# Patient Record
Sex: Male | Born: 1965 | Race: Black or African American | Hispanic: No | Marital: Single | Smoking: Current every day smoker
Health system: Southern US, Community
[De-identification: ages and names within clinical notes are randomized; demographics above are authoritative.]

## PROBLEM LIST (undated history)

## (undated) DIAGNOSIS — J45909 Unspecified asthma, uncomplicated: Secondary | ICD-10-CM

## (undated) DIAGNOSIS — I10 Essential (primary) hypertension: Secondary | ICD-10-CM

## (undated) DIAGNOSIS — E785 Hyperlipidemia, unspecified: Secondary | ICD-10-CM

## (undated) DIAGNOSIS — J449 Chronic obstructive pulmonary disease, unspecified: Secondary | ICD-10-CM

## (undated) DIAGNOSIS — J9690 Respiratory failure, unspecified, unspecified whether with hypoxia or hypercapnia: Secondary | ICD-10-CM

## (undated) HISTORY — DX: Unspecified asthma, uncomplicated: J45.909

## (undated) HISTORY — DX: Hyperlipidemia, unspecified: E78.5

## (undated) HISTORY — PX: NO PAST SURGERIES: SHX2092

## (undated) HISTORY — DX: Respiratory failure, unspecified, unspecified whether with hypoxia or hypercapnia: J96.90

## (undated) HISTORY — DX: Chronic obstructive pulmonary disease, unspecified: J44.9

---

## 1998-07-25 ENCOUNTER — Emergency Department (HOSPITAL_COMMUNITY): Admission: EM | Admit: 1998-07-25 | Discharge: 1998-07-25 | Payer: Self-pay | Admitting: Emergency Medicine

## 1998-07-26 ENCOUNTER — Encounter: Payer: Self-pay | Admitting: Emergency Medicine

## 2002-02-25 ENCOUNTER — Encounter: Payer: Self-pay | Admitting: Emergency Medicine

## 2002-02-25 ENCOUNTER — Emergency Department (HOSPITAL_COMMUNITY): Admission: EM | Admit: 2002-02-25 | Discharge: 2002-02-25 | Payer: Self-pay | Admitting: Emergency Medicine

## 2005-10-13 ENCOUNTER — Emergency Department (HOSPITAL_COMMUNITY): Admission: EM | Admit: 2005-10-13 | Discharge: 2005-10-13 | Payer: Self-pay | Admitting: Emergency Medicine

## 2009-05-11 ENCOUNTER — Emergency Department (HOSPITAL_COMMUNITY): Admission: EM | Admit: 2009-05-11 | Discharge: 2009-05-11 | Payer: Self-pay | Admitting: Emergency Medicine

## 2009-12-31 ENCOUNTER — Emergency Department (HOSPITAL_COMMUNITY): Admission: EM | Admit: 2009-12-31 | Discharge: 2009-12-31 | Payer: Self-pay | Admitting: Emergency Medicine

## 2010-04-27 LAB — POCT I-STAT, CHEM 8
BUN: 9 mg/dL (ref 6–23)
Calcium, Ion: 1.18 mmol/L (ref 1.12–1.32)
Chloride: 105 mEq/L (ref 96–112)
Creatinine, Ser: 1.1 mg/dL (ref 0.4–1.5)
Glucose, Bld: 93 mg/dL (ref 70–99)
HCT: 52 % (ref 39.0–52.0)
Hemoglobin: 17.7 g/dL — ABNORMAL HIGH (ref 13.0–17.0)
Potassium: 4.1 mEq/L (ref 3.5–5.1)
Sodium: 140 mEq/L (ref 135–145)
TCO2: 26 mmol/L (ref 0–100)

## 2010-06-16 ENCOUNTER — Emergency Department (HOSPITAL_COMMUNITY): Payer: Self-pay

## 2010-06-16 ENCOUNTER — Emergency Department (HOSPITAL_COMMUNITY)
Admission: EM | Admit: 2010-06-16 | Discharge: 2010-06-16 | Disposition: A | Discharge status: Home / Self Care | Payer: Self-pay | Attending: Emergency Medicine | Admitting: Emergency Medicine

## 2010-06-16 DIAGNOSIS — R220 Localized swelling, mass and lump, head: Secondary | ICD-10-CM | POA: Insufficient documentation

## 2010-06-16 DIAGNOSIS — I10 Essential (primary) hypertension: Secondary | ICD-10-CM | POA: Insufficient documentation

## 2010-06-16 DIAGNOSIS — H6690 Otitis media, unspecified, unspecified ear: Secondary | ICD-10-CM | POA: Insufficient documentation

## 2010-06-16 DIAGNOSIS — R51 Headache: Secondary | ICD-10-CM | POA: Insufficient documentation

## 2010-06-16 DIAGNOSIS — H7090 Unspecified mastoiditis, unspecified ear: Secondary | ICD-10-CM | POA: Insufficient documentation

## 2010-06-16 LAB — DIFFERENTIAL
Basophils Absolute: 0 10*3/uL (ref 0.0–0.1)
Basophils Relative: 1 % (ref 0–1)
Eosinophils Absolute: 0.2 10*3/uL (ref 0.0–0.7)
Eosinophils Relative: 4 % (ref 0–5)
Lymphocytes Relative: 33 % (ref 12–46)
Lymphs Abs: 2.1 10*3/uL (ref 0.7–4.0)
Monocytes Absolute: 0.6 10*3/uL (ref 0.1–1.0)
Monocytes Relative: 10 % (ref 3–12)
Neutro Abs: 3.3 10*3/uL (ref 1.7–7.7)
Neutrophils Relative %: 53 % (ref 43–77)

## 2010-06-16 LAB — CBC
Hemoglobin: 15 g/dL (ref 13.0–17.0)
MCH: 32.4 pg (ref 26.0–34.0)
MCHC: 33.3 g/dL (ref 30.0–36.0)
Platelets: 186 10*3/uL (ref 150–400)
RDW: 14.3 % (ref 11.5–15.5)

## 2010-06-16 MED ORDER — IOHEXOL 300 MG/ML  SOLN
100.0000 mL | Freq: Once | INTRAMUSCULAR | Status: AC | PRN
  Administered 2010-06-16: 100 mL via INTRAVENOUS

## 2010-06-18 LAB — POCT I-STAT, CHEM 8
Potassium: 4 mEq/L (ref 3.5–5.1)
Sodium: 140 mEq/L (ref 135–145)

## 2011-05-11 ENCOUNTER — Encounter (HOSPITAL_COMMUNITY): Payer: Self-pay

## 2011-05-11 ENCOUNTER — Telehealth (HOSPITAL_COMMUNITY): Payer: Self-pay | Admitting: *Deleted

## 2011-05-11 ENCOUNTER — Emergency Department (INDEPENDENT_AMBULATORY_CARE_PROVIDER_SITE_OTHER)
Admission: EM | Admit: 2011-05-11 | Discharge: 2011-05-11 | Disposition: A | Discharge status: Home / Self Care | Payer: Self-pay | Source: Home / Self Care | Attending: Family Medicine | Admitting: Family Medicine

## 2011-05-11 DIAGNOSIS — I10 Essential (primary) hypertension: Secondary | ICD-10-CM

## 2011-05-11 DIAGNOSIS — M25562 Pain in left knee: Secondary | ICD-10-CM

## 2011-05-11 DIAGNOSIS — M25569 Pain in unspecified knee: Secondary | ICD-10-CM

## 2011-05-11 HISTORY — DX: Morbid (severe) obesity due to excess calories: E66.01

## 2011-05-11 HISTORY — DX: Essential (primary) hypertension: I10

## 2011-05-11 MED ORDER — LISINOPRIL-HYDROCHLOROTHIAZIDE 20-25 MG PO TABS: 1.0000 | ORAL_TABLET | Freq: Every day | ORAL | Status: DC

## 2011-05-11 MED ORDER — TRAMADOL HCL 50 MG PO TABS: 50.0000 mg | ORAL_TABLET | Freq: Four times a day (QID) | ORAL | Status: AC | PRN

## 2011-05-11 MED ORDER — NAPROXEN 500 MG PO TABS: 500.0000 mg | ORAL_TABLET | Freq: Two times a day (BID) | ORAL | Status: DC

## 2011-05-11 NOTE — Discharge Instructions (Signed)
Take medications as directed. Also, continue to work on weight loss as discussed, as this will likely improve your symptoms. Once your Medicaid has been approved, please return for followup evaluation for x-rays and referral to an orthopedist. Also, please call the [facility] line listed on your discharge paperwork, and ask about primary providers accept Medicaid. Please establish care with a primary care provider. Return to care should your symptoms not improve, or worsen in any way, such as chest pain, shortness of breath, vision changes, or any symptom that might concern you.

## 2011-05-11 NOTE — ED Notes (Signed)
Pt. called on VM and wants to speak to Dr. Juanetta Gosling. I called pt. back and he said he came in and saw Dr. Juanetta Gosling. I said OK, thank you. Aaron Bishop 05/11/2011

## 2011-05-11 NOTE — ED Provider Notes (Signed)
History     CSN: [id number]  Arrival date & time 05/11/11  1003   First MD Initiated Contact with Patient 05/11/11 1037      Chief Complaint  Patient presents with  . Joint Pain    (Consider location/radiation/quality/duration/timing/severity/associated sxs/prior treatment) HPI Comments: Aaron Bishop presents for evaluation for high blood pressure, and chronic, bilateral knee pain. He reports old injuries in his right knee from playing football earlier in his life. He reports some ligamentous damage. He now reports chronic knee pain with walking and bending. He was also diagnosed with hypertension at this facility several months ago and started on blood pressure pills. He requests a refill of those today. He denies any chest pain, shortness of breath, visual changes, or headache. He reports that his Medicaid is pending, but he does not have a primary care provider at this time.  Patient is a 46 y.o. male presenting with knee pain. The history is provided by the patient.  Knee Pain This is a recurrent problem. The current episode started more than 1 week ago. The problem occurs constantly. The problem has been gradually worsening. The symptoms are aggravated by walking, bending and standing. The symptoms are relieved by acetaminophen and narcotics. He has tried nothing for the symptoms.    Past Medical History  Diagnosis Date  . Hypertension   . Morbid obesity     History reviewed. No pertinent past surgical history.  History reviewed. No pertinent family history.  History  Substance Use Topics  . Smoking status: Current Everyday Smoker  . Smokeless tobacco: Not on file  . Alcohol Use: Yes      Review of Systems  Constitutional: Negative.   HENT: Negative.   Eyes: Negative.   Respiratory: Negative.   Cardiovascular: Negative.   Gastrointestinal: Negative.   Genitourinary: Negative.   Musculoskeletal: Positive for arthralgias.  Skin: Negative.   Neurological: Negative.      Allergies  Review of patient's allergies indicates no known allergies.  Home Medications   Current Outpatient Rx  Name Route Sig Dispense Refill  . HYDROCHLOROTHIAZIDE 50 MG PO TABS Oral Take 50 mg by mouth daily.    Marland Kitchen LISINOPRIL 40 MG PO TABS Oral Take 40 mg by mouth daily.    Marland Kitchen LISINOPRIL-HYDROCHLOROTHIAZIDE 20-25 MG PO TABS Oral Take 1 tablet by mouth daily. 30 tablet 1  . NAPROXEN 500 MG PO TABS Oral Take 1 tablet (500 mg total) by mouth 2 (two) times daily with a meal. 60 tablet 0  . TRAMADOL HCL 50 MG PO TABS Oral Take 1 tablet (50 mg total) by mouth every 6 (six) hours as needed for pain. Maximum dose= 8 tablets per day 30 tablet 0    BP 160/96  Pulse 81  Temp(Src) 97.7 F (36.5 C) (Oral)  Resp 24  SpO2 97%  Physical Exam  Nursing note and vitals reviewed. Constitutional: He is oriented to person, place, and time. He appears well-developed and well-nourished.  HENT:  Head: Normocephalic and atraumatic.  Eyes: EOM are normal.  Neck: Normal range of motion.  Pulmonary/Chest: Effort normal.  Musculoskeletal: Normal range of motion.       Right knee: He exhibits normal range of motion, no swelling, no bony tenderness and normal meniscus. tenderness found. Medial joint line and lateral joint line tenderness noted. No patellar tendon tenderness noted.       RIGHT Knee: negative patellar grind, negative patellar facet tenderness, positive medial and lateral joint line tenderness, negative patellar tendon tenderness, negative  Lachman's, positive McMurray's; no laxity or pain with varus or valgus stress   Neurological: He is alert and oriented to person, place, and time.  Skin: Skin is warm and dry.  Psychiatric: His behavior is normal.    ED Course  Procedures (including critical care time)  Labs Reviewed - No data to display No results found.   1. Hypertension   2. Knee pain, bilateral       MDM  Refilled HTN medications; rx given for naproxen and  hydrocodone; will xray and refer to orthopaedics after Medicaid; given [facility] for PCP referral         Renaee Munda, MD 05/11/11 1225

## 2011-05-11 NOTE — ED Notes (Signed)
C/o pain in both knees, R>L , for some time; waiting for medicaid to be in effect; out of his BP medication, needs refills

## 2011-05-31 ENCOUNTER — Telehealth (HOSPITAL_COMMUNITY): Payer: Self-pay | Admitting: *Deleted

## 2011-05-31 NOTE — ED Notes (Signed)
Pt. called on VM and said he needs his meds refilled.  I called pt. back and he said he needs his BP pills and pain pills refilled. I told him he could take 2 Aleve 440 mg. that are equivalent to 1 Naprosyn 500 mg twice a day. He said he was taking Tramadol. I asked if we referred him to anyone and he said no. I looked up his d/c instructions and read them to him. It said to call [facility] and ask for doctors that take new Medicaid pt.'s and to get a PCP. He said he applied for Medicaid in Jan. and is trying to get disability. I told him we don't do disability papers or refills because we are not a primary care facility.  I told pt. he would have to come for a recheck and gave him the [facility] number. He thinks he will have his Medicaid by the end of the month. Vassie Moselle 05/31/2011

## 2011-12-27 ENCOUNTER — Inpatient Hospital Stay (HOSPITAL_COMMUNITY)
Admission: EM | Admit: 2011-12-27 | Discharge: 2012-01-01 | DRG: 190 | Disposition: A | Discharge status: Home / Self Care | Payer: 59 | Attending: Internal Medicine | Admitting: Internal Medicine

## 2011-12-27 ENCOUNTER — Emergency Department (HOSPITAL_COMMUNITY): Payer: Self-pay

## 2011-12-27 ENCOUNTER — Encounter (HOSPITAL_COMMUNITY): Payer: Self-pay | Admitting: *Deleted

## 2011-12-27 DIAGNOSIS — R609 Edema, unspecified: Secondary | ICD-10-CM | POA: Diagnosis present

## 2011-12-27 DIAGNOSIS — J440 Chronic obstructive pulmonary disease with acute lower respiratory infection: Principal | ICD-10-CM | POA: Diagnosis present

## 2011-12-27 DIAGNOSIS — R0602 Shortness of breath: Secondary | ICD-10-CM

## 2011-12-27 DIAGNOSIS — J9692 Respiratory failure, unspecified with hypercapnia: Secondary | ICD-10-CM

## 2011-12-27 DIAGNOSIS — J9600 Acute respiratory failure, unspecified whether with hypoxia or hypercapnia: Secondary | ICD-10-CM | POA: Diagnosis present

## 2011-12-27 DIAGNOSIS — J9620 Acute and chronic respiratory failure, unspecified whether with hypoxia or hypercapnia: Secondary | ICD-10-CM | POA: Diagnosis not present

## 2011-12-27 DIAGNOSIS — Z79899 Other long term (current) drug therapy: Secondary | ICD-10-CM

## 2011-12-27 DIAGNOSIS — Z6841 Body Mass Index (BMI) 40.0 and over, adult: Secondary | ICD-10-CM

## 2011-12-27 DIAGNOSIS — I10 Essential (primary) hypertension: Secondary | ICD-10-CM | POA: Diagnosis present

## 2011-12-27 DIAGNOSIS — R0601 Orthopnea: Secondary | ICD-10-CM

## 2011-12-27 DIAGNOSIS — J45901 Unspecified asthma with (acute) exacerbation: Secondary | ICD-10-CM

## 2011-12-27 DIAGNOSIS — G4733 Obstructive sleep apnea (adult) (pediatric): Secondary | ICD-10-CM | POA: Diagnosis present

## 2011-12-27 DIAGNOSIS — F17200 Nicotine dependence, unspecified, uncomplicated: Secondary | ICD-10-CM | POA: Diagnosis present

## 2011-12-27 DIAGNOSIS — J9691 Respiratory failure, unspecified with hypoxia: Secondary | ICD-10-CM

## 2011-12-27 DIAGNOSIS — R600 Localized edema: Secondary | ICD-10-CM

## 2011-12-27 DIAGNOSIS — J209 Acute bronchitis, unspecified: Principal | ICD-10-CM | POA: Diagnosis present

## 2011-12-27 LAB — PRO B NATRIURETIC PEPTIDE: Pro B Natriuretic peptide (BNP): 67.8 pg/mL (ref 0–125)

## 2011-12-27 LAB — URINALYSIS, ROUTINE W REFLEX MICROSCOPIC
Bilirubin Urine: NEGATIVE
Glucose, UA: >1000 mg/dL — AB
Hgb urine dipstick: NEGATIVE
Ketones, ur: NEGATIVE mg/dL
Leukocytes, UA: NEGATIVE
Nitrite: NEGATIVE
Protein, ur: NEGATIVE mg/dL
Specific Gravity, Urine: 1.015 (ref 1.005–1.030)
Urobilinogen, UA: 1 mg/dL (ref 0.0–1.0)
pH: 6.5 (ref 5.0–8.0)

## 2011-12-27 LAB — CBC WITH DIFFERENTIAL/PLATELET
Basophils Absolute: 0 K/uL (ref 0.0–0.1)
Basophils Relative: 0 % (ref 0–1)
Eosinophils Absolute: 0.2 K/uL (ref 0.0–0.7)
Eosinophils Relative: 5 % (ref 0–5)
HCT: 42.6 % (ref 39.0–52.0)
Hemoglobin: 14.4 g/dL (ref 13.0–17.0)
Lymphocytes Relative: 31 % (ref 12–46)
Lymphs Abs: 1.6 K/uL (ref 0.7–4.0)
MCH: 32.4 pg (ref 26.0–34.0)
MCHC: 33.8 g/dL (ref 30.0–36.0)
MCV: 95.7 fL (ref 78.0–100.0)
Monocytes Absolute: 0.6 K/uL (ref 0.1–1.0)
Monocytes Relative: 12 % (ref 3–12)
Neutro Abs: 2.6 K/uL (ref 1.7–7.7)
Neutrophils Relative %: 52 % (ref 43–77)
Platelets: 179 K/uL (ref 150–400)
RBC: 4.45 MIL/uL (ref 4.22–5.81)
RDW: 15.1 % (ref 11.5–15.5)
WBC: 5.1 K/uL (ref 4.0–10.5)

## 2011-12-27 LAB — TROPONIN I: Troponin I: <0.3 ng/mL (ref <0.30)

## 2011-12-27 LAB — BASIC METABOLIC PANEL
CO2: 30 mEq/L (ref 19–32)
Calcium: 9.3 mg/dL (ref 8.4–10.5)
Creatinine, Ser: 0.81 mg/dL (ref 0.50–1.35)

## 2011-12-27 LAB — URINE MICROSCOPIC-ADD ON

## 2011-12-27 MED ORDER — ALBUTEROL SULFATE (5 MG/ML) 0.5% IN NEBU
10.0000 mg | INHALATION_SOLUTION | Freq: Once | RESPIRATORY_TRACT | Status: AC
  Administered 2011-12-27: 10 mg via RESPIRATORY_TRACT

## 2011-12-27 MED ORDER — AZITHROMYCIN 500 MG PO TABS
500.0000 mg | ORAL_TABLET | Freq: Every day | ORAL | Status: DC
  Administered 2011-12-27: 500 mg via ORAL
  Filled 2011-12-27 (×2): qty 1

## 2011-12-27 MED ORDER — METHYLPREDNISOLONE SODIUM SUCC 125 MG IJ SOLR
60.0000 mg | Freq: Four times a day (QID) | INTRAMUSCULAR | Status: DC
  Administered 2011-12-27 – 2011-12-29 (×6): 60 mg via INTRAVENOUS
  Filled 2011-12-27 (×3): qty 0.96
  Filled 2011-12-27: qty 2
  Filled 2011-12-27 (×7): qty 0.96

## 2011-12-27 MED ORDER — IPRATROPIUM BROMIDE 0.02 % IN SOLN
0.5000 mg | Freq: Once | RESPIRATORY_TRACT | Status: AC
Start: 2011-12-27 — End: 2011-12-27
  Administered 2011-12-27: 0.5 mg via RESPIRATORY_TRACT
  Filled 2011-12-27: qty 2.5

## 2011-12-27 MED ORDER — ALBUTEROL SULFATE (5 MG/ML) 0.5% IN NEBU: 2.5000 mg | INHALATION_SOLUTION | Freq: Once | RESPIRATORY_TRACT | Status: DC

## 2011-12-27 MED ORDER — ALBUTEROL SULFATE (5 MG/ML) 0.5% IN NEBU
2.5000 mg | INHALATION_SOLUTION | RESPIRATORY_TRACT | Status: DC
  Administered 2011-12-27 – 2011-12-31 (×20): 2.5 mg via RESPIRATORY_TRACT
  Filled 2011-12-27 (×21): qty 0.5

## 2011-12-27 MED ORDER — ALBUTEROL SULFATE (5 MG/ML) 0.5% IN NEBU
2.5000 mg | INHALATION_SOLUTION | RESPIRATORY_TRACT | Status: DC
  Administered 2011-12-27: 2.5 mg via RESPIRATORY_TRACT
  Filled 2011-12-27: qty 0.5

## 2011-12-27 MED ORDER — ALBUTEROL SULFATE (5 MG/ML) 0.5% IN NEBU
2.5000 mg | INHALATION_SOLUTION | Freq: Once | RESPIRATORY_TRACT | Status: AC
  Administered 2011-12-27: 2.5 mg via RESPIRATORY_TRACT

## 2011-12-27 MED ORDER — IPRATROPIUM BROMIDE 0.02 % IN SOLN
0.5000 mg | Freq: Three times a day (TID) | RESPIRATORY_TRACT | Status: DC
  Administered 2011-12-28: 0.5 mg via RESPIRATORY_TRACT
  Filled 2011-12-27: qty 2.5

## 2011-12-27 MED ORDER — METHYLPREDNISOLONE SODIUM SUCC 125 MG IJ SOLR
125.0000 mg | Freq: Once | INTRAMUSCULAR | Status: AC
  Administered 2011-12-27: 125 mg via INTRAVENOUS
  Filled 2011-12-27: qty 2

## 2011-12-27 MED ORDER — ENOXAPARIN SODIUM 100 MG/ML ~~LOC~~ SOLN
100.0000 mg | SUBCUTANEOUS | Status: DC
  Administered 2011-12-27: 100 mg via SUBCUTANEOUS
  Filled 2011-12-27 (×2): qty 1

## 2011-12-27 MED ORDER — LISINOPRIL 40 MG PO TABS
40.0000 mg | ORAL_TABLET | Freq: Every day | ORAL | Status: DC
  Administered 2011-12-27: 40 mg via ORAL
  Filled 2011-12-27 (×2): qty 1

## 2011-12-27 MED ORDER — ALBUTEROL SULFATE (5 MG/ML) 0.5% IN NEBU
2.5000 mg | INHALATION_SOLUTION | RESPIRATORY_TRACT | Status: DC | PRN
  Administered 2011-12-28: 2.5 mg via RESPIRATORY_TRACT
  Filled 2011-12-27: qty 0.5

## 2011-12-27 MED ORDER — SODIUM CHLORIDE 0.9 % IJ SOLN
3.0000 mL | Freq: Two times a day (BID) | INTRAMUSCULAR | Status: DC
  Administered 2011-12-27 – 2012-01-01 (×6): 3 mL via INTRAVENOUS

## 2011-12-27 MED ORDER — ALBUTEROL SULFATE HFA 108 (90 BASE) MCG/ACT IN AERS
2.0000 | INHALATION_SPRAY | Freq: Four times a day (QID) | RESPIRATORY_TRACT | Status: DC
  Filled 2011-12-27: qty 6.7

## 2011-12-27 MED ORDER — GUAIFENESIN-DM 100-10 MG/5ML PO SYRP
5.0000 mL | ORAL_SOLUTION | ORAL | Status: DC | PRN
Start: 2011-12-27 — End: 2012-01-01

## 2011-12-27 MED ORDER — NITROGLYCERIN 0.4 MG SL SUBL
0.4000 mg | SUBLINGUAL_TABLET | SUBLINGUAL | Status: DC | PRN
  Administered 2011-12-27: 0.4 mg via SUBLINGUAL

## 2011-12-27 MED ORDER — FUROSEMIDE 10 MG/ML IJ SOLN
40.0000 mg | Freq: Two times a day (BID) | INTRAMUSCULAR | Status: DC
  Administered 2011-12-27 – 2011-12-28 (×2): 40 mg via INTRAVENOUS
  Filled 2011-12-27 (×3): qty 4

## 2011-12-27 MED ORDER — SODIUM CHLORIDE 0.9 % IJ SOLN: 3.0000 mL | INTRAMUSCULAR | Status: DC | PRN

## 2011-12-27 MED ORDER — ALBUTEROL SULFATE (5 MG/ML) 0.5% IN NEBU
INHALATION_SOLUTION | RESPIRATORY_TRACT | Status: AC
  Administered 2011-12-27: 5 mg
  Filled 2011-12-27: qty 1

## 2011-12-27 MED ORDER — ALBUTEROL SULFATE (5 MG/ML) 0.5% IN NEBU
2.5000 mg | INHALATION_SOLUTION | Freq: Once | RESPIRATORY_TRACT | Status: DC
  Filled 2011-12-27: qty 0.5

## 2011-12-27 MED ORDER — SODIUM CHLORIDE 0.9 % IV SOLN: 250.0000 mL | INTRAVENOUS | Status: DC | PRN

## 2011-12-27 MED ORDER — BIOTENE DRY MOUTH MT LIQD
15.0000 mL | Freq: Two times a day (BID) | OROMUCOSAL | Status: DC
  Administered 2011-12-27 – 2011-12-28 (×2): 15 mL via OROMUCOSAL

## 2011-12-27 MED ORDER — NITROGLYCERIN 0.4 MG SL SUBL
SUBLINGUAL_TABLET | SUBLINGUAL | Status: AC
  Administered 2011-12-27: 0.4 mg via SUBLINGUAL
  Filled 2011-12-27: qty 25

## 2011-12-27 NOTE — ED Notes (Signed)
Pt presents with acute respiratory distress. Onset 3 days.  Out of meds x 1 day

## 2011-12-27 NOTE — H&P (Addendum)
[facility] History and Physical  Aaron Bishop WGN:[id number] DOB: 1966/01/05 DOA: 12/27/2011  Referring physician:Dr bonk PCP: does not have PCP   Chief Complaint: progressive shortness of breath x 3 weeks  HPI:  46 y/o morbidly obese male with hx of HTN presents to ED withe progressive SOB since 3 weeks . patient also informs worsening of his symptoms with dyspnea at rest along with orthopnea and PND for past 7-10 days . He has been having cough with whitish phlegm for the same time and more wheezy. Denies any chest pain, palpitations, nausea, vomiting, fever, or chills. Denies any bowel or urinary symptoms. He informs having leg welling off and on and was prescribed HCTZ as oupt but hasn't taker it  since 3 months.  He informs  smoking 6-7 cigarettes daily for past several years.  In the ED he was noted to desat on RA and placed on nasal canula. IV solumedrol was given .  CXR was negative for pulm edema and showed some atelectasis. Pro BNP was low. [facility] called to admit patient under observation for shortness of breath.   Review of Systems:  Constitutional: Denies fever, chills, diaphoresis, appetite change and fatigue.  HEENT: Denies photophobia, eye pain, redness, hearing loss, ear pain, congestion, sore throat, rhinorrhea, sneezing, mouth sores, trouble swallowing, neck pain, neck stiffness and tinnitus.   Respiratory:  SOB at rest , DOE, cough with whitish phlegm and wheezing.  , Denies chest tightness,   Cardiovascular: orthopnea and PND, Denies chest pain, palpitations,  leg swelling +.  Gastrointestinal: Denies nausea, vomiting, abdominal pain, diarrhea, constipation, blood in stool and abdominal distention.  Genitourinary: Denies dysuria, urgency, frequency, hematuria, flank pain and difficulty urinating.  Musculoskeletal: Denies myalgias, back pain, joint swelling, arthralgias and gait problem.  Skin: Denies pallor, rash and wound.  Neurological: Denies  dizziness, seizures, syncope, weakness, light-headedness, numbness and headaches.  Hematological: Denies adenopathy. Easy bruising, personal or family bleeding history  Psychiatric/Behavioral: Denies suicidal ideation, mood changes, confusion, nervousness, sleep disturbance and agitation   Past Medical History  Diagnosis Date  . Hypertension   . Morbid obesity    Past Surgical History  Procedure Date  . No past surgeries    Social History:  reports that he has been smoking Cigarettes.  He has a 15 pack-year smoking history. He has never used smokeless tobacco. He reports that he drinks alcohol. He reports that he does not use illicit drugs.  No Known Allergies  Family history: not obtained  Prior to Admission medications   Medication Sig Start Date End Date Taking? Authorizing Provider  acetaminophen (TYLENOL) 500 MG tablet Take 500 mg by mouth every 6 (six) hours as needed. pain   Yes Historical Provider, MD  cephALEXin (KEFLEX) 500 MG capsule Take 500 mg by mouth 4 (four) times daily. x7 days filled 11/1   Yes Historical Provider, MD  hydrochlorothiazide (HYDRODIURIL) 50 MG tablet Take 50 mg by mouth daily.   Yes Historical Provider, MD  lisinopril (PRINIVIL,ZESTRIL) 40 MG tablet Take 40 mg by mouth daily.   Yes Historical Provider, MD  loratadine (CLARITIN) 10 MG tablet Take 10 mg by mouth daily.   Yes Historical Provider, MD    Physical Exam:  Filed Vitals:   12/27/11 1032 12/27/11 1043 12/27/11 1331 12/27/11 1400  BP: 168/87  145/73 154/69  Pulse: 87  96 82  Resp: 25  20 21   SpO2: 94% 96% 95% 96%    Constitutional: Vital signs reviewed.  Patient is a  morbidly obese male lying inn bed , able to speak in full sentences. Head: Normocephalic and atraumatic Ear: TM normal bilaterally Mouth: no erythema or exudates, MMM Eyes: PERRL, EOMI, conjunctivae normal, No scleral icterus.  Neck: Supple, Trachea midline normal ROM, No JVD, mass, thyromegaly, or carotid bruit present.    Cardiovascular: RRR, S1 normal, S2 normal, no MRG, pulses symmetric and intact bilaterally Pulmonary/Chest: CTAB, diminished breath sounds due to body habitus Abdominal: obese, Soft. Non-tender, non-distended, bowel sounds are normal, no masses, organomegaly, or guarding present.  GU: no CVA tenderness Musculoskeletal: No joint deformities, erythema, or stiffness, ROM full and no nontender Ext: 2 + pitting edema,   no cyanosis, pulses palpable bilaterally (DP and PT) Hematology: no cervical, inginal, or axillary adenopathy.  Neurological: A&O x3, Strenght is normal and symmetric bilaterally, cranial nerve II-XII are grossly intact, no focal motor deficit, sensory intact to light touch bilaterally.  Skin: Warm, dry and intact. No rash, cyanosis, or clubbing.  Psychiatric: Normal mood and affect. speech and behavior is normal. Judgment and thought content normal. Cognition and memory are normal.   Labs on Admission:  Basic Metabolic Panel:  Lab 12/27/11 1610  NA 139  K 4.9  CL 103  CO2 30  GLUCOSE 104*  BUN 10  CREATININE 0.81  CALCIUM 9.3  MG --  PHOS --   Liver Function Tests: No results found for this basename: AST:5,ALT:5,ALKPHOS:5,BILITOT:5,PROT:5,ALBUMIN:5 in the last 168 hours No results found for this basename: LIPASE:5,AMYLASE:5 in the last 168 hours No results found for this basename: AMMONIA:5 in the last 168 hours CBC:  Lab 12/27/11 1040  WBC 5.1  NEUTROABS 2.6  HGB 14.4  HCT 42.6  MCV 95.7  PLT 179   Cardiac Enzymes:  Lab 12/27/11 1145  CKTOTAL --  CKMB --  CKMBINDEX --  TROPONINI <0.30   BNP: No components found with this basename: POCBNP:5 CBG: No results found for this basename: GLUCAP:5 in the last 168 hours  Radiological Exams on Admission: Dg Chest 2 View  12/27/2011  *RADIOLOGY REPORT*  Clinical Data: Shortness of breath, asthma  CHEST - 2 VIEW  Comparison: 05/11/2009  Findings: Study is markedly limited by patient's large body habitus.   Cardiomegaly is noted.  Lung bases are obscured by overlying chest wall tissue.  I cannot exclude bilateral basilar atelectasis or infiltrate.  There is no convincing pulmonary edema. No diagnostic pneumothorax.  IMPRESSION: Study is markedly limited by patient's large body habitus. Cardiomegaly is noted.  Lung bases are obscured by overlying chest wall tissue.  I cannot exclude bilateral basilar atelectasis or infiltrate.  There is no convincing pulmonary edema.  No diagnostic pneumothorax.   Original Report Authenticated By: Natasha Mead, M.D.     EKG: NSR @86 , no ST-T changes  Assessment/Plan  Principal Problem:   *Shortness of breath Possibly related to reactive airway disease with asthma and bronchitis vs CHF exacerbation. Given symptoms of progressive SOB ion rst woth orthopnea and PND and worsenifn elg swelling, CHF is a high likelyhood.  Admit to medical floor under 23 hour observation  -started on IV solumedriol 60 mg q6h. Scheduled and prn nebs  -will place on IV lasix 40 mg bid  daily weights and I/Os  check 2 D echo  o2 via [place]  Active Problems:  OSA (obstructive sleep apnea) As per history. Has severe snoring during night with frequent awakening and daytime sleepiness  needs outpatient follow up   Morbid obesity counsled on weight reduction   Hypertension Was  on HCTZ and lisinopril in the  past . Hs not taken meds since 3 months Will start lisinopril   Tobacco abuse  counseled on cessation  DVT prophylaxis: sq lovenox  Diet: low sodium  Code Status: full Family Communication: Updated at bedside Disposition Plan: home once stable  Eddie North [facility] Pager [phone]  If 7PM-7AM, please contact night-coverage [email or web address] Password [facility] 12/27/2011, 4:44 PM      Total time spent: 60 minutes

## 2011-12-27 NOTE — ED Provider Notes (Signed)
History     CSN: [id number]  Arrival date & time 12/27/11  1018   First MD Initiated Contact with Patient 12/27/11 1032      No chief complaint on file.   (Consider location/radiation/quality/duration/timing/severity/associated sxs/prior treatment) HPICharles E Bishop is a 46 y.o. male with a h/o HTN, morbid obesity, SOB at rest and with activity and orthopnea for a few weeks now that has acutely worsened over last few days and now it is severe.  Pt has not taken or done anything for this condition, except that he sleeps almost upright.  He has a cough productive of scant white phlegm, no fevers, chills or chest pain.  He is also complaining about lower extremity edema which has worsening acutely.  He smoke cigarettes.    Past Medical History  Diagnosis Date  . Hypertension   . Morbid obesity     No past surgical history on file.  No family history on file.  History  Substance Use Topics  . Smoking status: Current Every Day Smoker  . Smokeless tobacco: Not on file  . Alcohol Use: Yes      Review of Systems At least 10pt or greater review of systems completed and are negative except where specified in the HPI.  Allergies  Review of patient's allergies indicates no known allergies.  Home Medications   Current Outpatient Rx  Name  Route  Sig  Dispense  Refill  . HYDROCHLOROTHIAZIDE 50 MG PO TABS   Oral   Take 50 mg by mouth daily.         Marland Kitchen LISINOPRIL 40 MG PO TABS   Oral   Take 40 mg by mouth daily.         Marland Kitchen LISINOPRIL-HYDROCHLOROTHIAZIDE 20-25 MG PO TABS   Oral   Take 1 tablet by mouth daily.   30 tablet   1   . NAPROXEN 500 MG PO TABS   Oral   Take 1 tablet (500 mg total) by mouth 2 (two) times daily with a meal.   60 tablet   0     Pulse 110  Resp 32  SpO2 83%  Physical Exam  Nursing notes reviewed.  Electronic medical record reviewed. VITAL SIGNS:   Filed Vitals:   01/01/12 1010 01/01/12 1011 01/01/12 1012 01/01/12 1345  BP:     151/66  Pulse:    83  Temp:    98.2 F (36.8 C)  TempSrc:    Oral  Resp:    18  Height:      Weight:      SpO2: 85% 77% 95% 93%   CONSTITUTIONAL: Awake, oriented, appears non-toxic, increased WOB HENT: Atraumatic, normocephalic, oral mucosa pink and moist, airway patent. Nares patent without drainage. External ears normal. EYES: Conjunctiva clear, EOMI, PERRLA NECK: Trachea midline, non-tender, supple CARDIOVASCULAR: Tachycardic rate, Normal rhythm, No murmurs, rubs, gallops PULMONARY/CHEST: Mild wheezing, moving fair about of air, mild crackles in bases. Symmetrical breath sounds. Non-tender. ABDOMINAL: Non-distended, morbidly obese, soft, non-tender - no rebound or guarding.  BS normal. NEUROLOGIC: Non-focal, moving all four extremities, no gross sensory or motor deficits. EXTREMITIES: No clubbing, cyanosis, or edema SKIN: Warm, Dry, No erythema, No rash   ED Course  Procedures (including critical care time)  Date: 01/03/2012  Rate: 86  Rhythm: normal sinus rhythm  QRS Axis: normal  Intervals: normal  ST/T Wave abnormalities: normal  Conduction Disutrbances: none  Narrative Interpretation: unremarkable     Labs Reviewed  BASIC METABOLIC PANEL - Abnormal;  Notable for the following:    Glucose, Bld 104 (*)     All other components within normal limits  URINALYSIS, ROUTINE W REFLEX MICROSCOPIC - Abnormal; Notable for the following:    APPearance CLOUDY (*)     Glucose, UA >1000 (*)     All other components within normal limits  BLOOD GAS, ARTERIAL - Abnormal; Notable for the following:    pH, Arterial 7.277 (*)     pCO2 arterial 65.8 (*)     pO2, Arterial 78.5 (*)     Bicarbonate 29.7 (*)     All other components within normal limits  BLOOD GAS, ARTERIAL - Abnormal; Notable for the following:    pH, Arterial 7.296 (*)     pCO2 arterial 69.0 (*)     Bicarbonate 32.6 (*)     Acid-Base Excess 4.1 (*)     All other components within normal limits  BLOOD GAS,  ARTERIAL - Abnormal; Notable for the following:    pH, Arterial 7.323 (*)     pCO2 arterial 64.9 (*)     pO2, Arterial 69.7 (*)     Bicarbonate 32.7 (*)     Acid-Base Excess 4.8 (*)     All other components within normal limits  CBC - Abnormal; Notable for the following:    WBC 13.3 (*)     All other components within normal limits  BASIC METABOLIC PANEL - Abnormal; Notable for the following:    CO2 33 (*)     Glucose, Bld 204 (*)     All other components within normal limits  BLOOD GAS, ARTERIAL - Abnormal; Notable for the following:    pCO2 arterial 51.9 (*)     pO2, Arterial 63.4 (*)     Bicarbonate 30.3 (*)     Acid-Base Excess 4.5 (*)     All other components within normal limits  BLOOD GAS, ARTERIAL - Abnormal; Notable for the following:    pCO2 arterial 52.6 (*)     pO2, Arterial 68.7 (*)     Bicarbonate 31.7 (*)     Acid-Base Excess 5.8 (*)     All other components within normal limits  CBC - Abnormal; Notable for the following:    WBC 10.6 (*)     All other components within normal limits  BASIC METABOLIC PANEL - Abnormal; Notable for the following:    Chloride 95 (*)     CO2 34 (*)     Glucose, Bld 173 (*)     All other components within normal limits  BASIC METABOLIC PANEL - Abnormal; Notable for the following:    CO2 33 (*)     Glucose, Bld 174 (*)     All other components within normal limits  CBC WITH DIFFERENTIAL  PRO B NATRIURETIC PEPTIDE  TROPONIN I  URINE MICROSCOPIC-ADD ON  MRSA PCR SCREENING  CBC  LAB REPORT - SCANNED   No results found.   1. Asthma exacerbation   2. Morbid obesity   3. Lower extremity edema   4. Orthopnea   5. Bilateral leg edema   6. Hypertension   7. Shortness of breath   8. OSA (obstructive sleep apnea)   9. Respiratory failure with hypercapnia   10. Respiratory failure with hypoxia       MDM  Aaron Bishop is a 46 y.o. male presenting with worsening dyspnea.  Pt has h/o asthma, but also has risk factors for  cardiogenic dyspnea such as CHF.  Pt given steroids, nebs in ER with minimal improvement - pt requiring O2 - does not use at home. CXR shows no infiltrates. Labs non-diagnostic.  Pt will require work up and further medical therapy as IP.  Pt also complains of snoring loudly and daytime sleepiness, likely also OSA.  D/W Hospitalist for admission         Jones Skene, MD 01/03/12 1647

## 2011-12-27 NOTE — ED Notes (Signed)
Patient O2 saturation with 2 Liters of oxygen was 97%.  When Oxygen was removed patients saturation level dropped to 90% .  Patient ambulated 30 feet and his O2 saturation stayed from 90% to 92% on room air.

## 2011-12-28 DIAGNOSIS — I517 Cardiomegaly: Secondary | ICD-10-CM

## 2011-12-28 DIAGNOSIS — J45901 Unspecified asthma with (acute) exacerbation: Secondary | ICD-10-CM

## 2011-12-28 DIAGNOSIS — J9691 Respiratory failure, unspecified with hypoxia: Secondary | ICD-10-CM | POA: Diagnosis not present

## 2011-12-28 DIAGNOSIS — J9620 Acute and chronic respiratory failure, unspecified whether with hypoxia or hypercapnia: Secondary | ICD-10-CM | POA: Diagnosis not present

## 2011-12-28 DIAGNOSIS — J9611 Chronic respiratory failure with hypoxia: Secondary | ICD-10-CM | POA: Diagnosis not present

## 2011-12-28 LAB — BLOOD GAS, ARTERIAL
Acid-Base Excess: 1.3 mmol/L (ref 0.0–2.0)
Acid-Base Excess: 4.1 mmol/L — ABNORMAL HIGH (ref 0.0–2.0)
Bicarbonate: 29.7 mEq/L — ABNORMAL HIGH (ref 20.0–24.0)
Bicarbonate: 32.6 mEq/L — ABNORMAL HIGH (ref 20.0–24.0)
Bicarbonate: 30.3 mEq/L — ABNORMAL HIGH (ref 20.0–24.0)
Delivery systems: POSITIVE
Delivery systems: POSITIVE
Drawn by: 317871
Drawn by: 317871
Drawn by: 295031
Expiratory PAP: 10
Expiratory PAP: 10
FIO2: 0.35 %
FIO2: 0.4 %
FIO2: 0.4 %
Inspiratory PAP: 20
Inspiratory PAP: 20
O2 Content: 3 L/min
O2 Saturation: 94.5 %
O2 Saturation: 96.8 %
O2 Saturation: 93.3 %
O2 Saturation: 92.2 %
Patient temperature: 98.6
Patient temperature: 98.6
Patient temperature: 98.6
Patient temperature: 98.6
TCO2: 26.6 mmol/L (ref 0–100)
TCO2: 29.3 mmol/L (ref 0–100)
pCO2 arterial: 65.8 mmHg (ref 35.0–45.0)
pCO2 arterial: 69 mmHg (ref 35.0–45.0)
pH, Arterial: 7.277 — ABNORMAL LOW (ref 7.350–7.450)
pH, Arterial: 7.296 — ABNORMAL LOW (ref 7.350–7.450)
pO2, Arterial: 78.5 mmHg — ABNORMAL LOW (ref 80.0–100.0)
pO2, Arterial: 92.8 mmHg (ref 80.0–100.0)

## 2011-12-28 LAB — MRSA PCR SCREENING: MRSA by PCR: NEGATIVE

## 2011-12-28 MED ORDER — IPRATROPIUM BROMIDE 0.02 % IN SOLN: 0.5000 mg | RESPIRATORY_TRACT | Status: DC | PRN

## 2011-12-28 MED ORDER — DEXTROSE 5 % IV SOLN
500.0000 mg | INTRAVENOUS | Status: DC
  Administered 2011-12-28 – 2011-12-29 (×2): 500 mg via INTRAVENOUS
  Filled 2011-12-28 (×2): qty 500

## 2011-12-28 MED ORDER — SENNOSIDES-DOCUSATE SODIUM 8.6-50 MG PO TABS
1.0000 | ORAL_TABLET | Freq: Two times a day (BID) | ORAL | Status: DC
  Administered 2011-12-28 – 2012-01-01 (×6): 1 via ORAL
  Filled 2011-12-28 (×10): qty 1

## 2011-12-28 MED ORDER — IPRATROPIUM BROMIDE 0.02 % IN SOLN
0.5000 mg | RESPIRATORY_TRACT | Status: DC
  Administered 2011-12-28 – 2011-12-30 (×11): 0.5 mg via RESPIRATORY_TRACT
  Filled 2011-12-28 (×12): qty 2.5

## 2011-12-28 MED ORDER — CHLORHEXIDINE GLUCONATE 0.12 % MT SOLN
15.0000 mL | Freq: Two times a day (BID) | OROMUCOSAL | Status: DC
  Administered 2011-12-29 – 2012-01-01 (×3): 15 mL via OROMUCOSAL
  Filled 2011-12-28 (×9): qty 15

## 2011-12-28 MED ORDER — ENOXAPARIN SODIUM 40 MG/0.4ML ~~LOC~~ SOLN
40.0000 mg | SUBCUTANEOUS | Status: DC
  Administered 2011-12-28 – 2011-12-31 (×4): 40 mg via SUBCUTANEOUS
  Filled 2011-12-28 (×5): qty 0.4

## 2011-12-28 MED ORDER — BIOTENE DRY MOUTH MT LIQD
15.0000 mL | Freq: Two times a day (BID) | OROMUCOSAL | Status: DC
  Administered 2011-12-29 – 2011-12-31 (×6): 15 mL via OROMUCOSAL

## 2011-12-28 MED ORDER — FUROSEMIDE 10 MG/ML IJ SOLN
20.0000 mg | Freq: Two times a day (BID) | INTRAMUSCULAR | Status: DC
  Administered 2011-12-28: 20 mg via INTRAVENOUS
  Filled 2011-12-28 (×2): qty 2

## 2011-12-28 NOTE — Progress Notes (Signed)
Patient began desatting on continous pulse oximetry to the low 80's on [place] 3L. Called NP Schorr about history of sleep apnea. Received verbal order to place on Venti mask for mouth breathing. Called into the room by patient's visitor found to be diaphoretic and lethargic. Vital signs taken. Patient became agitated, pulling off gown and mask and tremoring/shaking leg. Rapid Response Team called. ABG ordered, and patient transferred to [facility]. Report called to Elmarie Shiley, Charity fundraiser.

## 2011-12-28 NOTE — Significant Event (Signed)
Rapid Response Event Note  Overview: Time Called: 0150 Arrival Time: 0155 Event Type: Respiratory  Initial Focused Assessment: Pt with snoring respirations and difficult to arouse. He was able to wake up but very drowsy. Was on venti mask at 50%. Respiratory in room and NP called for ABG     Interventions: FIO2 weaned to 35% and resp treatment given. Abg done and called to NP   Event Summary:Pt transferred to 1238 for bipap Name of Physician Notified: Margaretmary Eddy NP at 0205 Event ended at 0300   at          [place] B

## 2011-12-28 NOTE — Progress Notes (Signed)
Patient ID: Aaron Bishop, male   DOB: 11/11/1965, 46 y.o.   MRN: [id number] [facility] PROGRESS NOTE  KOURTLAND COOPMAN KGM:[id number] DOB: [birth year] DOA: 12/27/2011 PCP: No primary provider on file.  Brief narrative: Pt is 46 y/o morbidly obese male with hx of HTN presented to ED with progressively worsening resting and exertional SOB that initially started 3 weeks prior to admission, associated with productive cough of yellow sputum, wheezing, subjective fevers and chills. Patient denies similar episodes in the past. He endorses one pack per day smoking history since age of 18. In emergency department patient desaturated to low 80%'s and has required admission to stepdown unit for further evaluation.   Principal Problem:  *Respiratory failure with hypercapnia and hypoxia - Based on clinical symptoms and imaging tests this is most likely secondary to acute exacerbation of COPD - We'll continue to monitor patient in step down unit, pCO2 on ABG is >60 mmHg - Patient is currently on BiPAP and will continue so, repeat ABG in 2 hours - Continue supportive care with nebulizers scheduled and as needed, empiric antibiotics azithromycin, Solu-Medrol IV - Patient will need pulmonary function tests eventually upon discharge Active Problems:  OSA (obstructive sleep apnea) - Patient is not aware of diagnosis of obstructive sleep apnea but is very likely given body habitus - Continue BiPAP for now, based on pulmonary function test patient will likely need CPAP at night time as well  Morbid obesity - Will discuss nutrition recommendations once patient is more clinically stable - BMI greater than 50  Hypertension - Will discontinue lisinopril for now as patient has been having chronic cough - Followup on 2-D echo and decide if ACE inhibitor needs to be restarted   Bilateral leg edema - Likely secondary to congestive heart failure - We'll continue Lasix IV for now - Followup on 2-D  echo  Consultants:  None  Procedures/Studies: Dg Chest 2 View 12/27/2011    IMPRESSION:   Study is markedly limited by patient's large body habitus. Cardiomegaly is noted.  Lung bases are obscured by overlying chest wall tissue.  I cannot exclude bilateral basilar atelectasis or infiltrate.  There is no convincing pulmonary edema.  No diagnostic pneumothorax.     Antibiotics:  Zithromax 11/12 -->  Code Status: Full Family Communication: Mother at bedside Disposition Plan: Home when medically stable  HPI/Subjective: No events overnight.   Objective: Filed Vitals:   12/28/11 1100 12/28/11 1155 12/28/11 1200 12/28/11 1415  BP:    131/71  Pulse: 73 79  88  Temp:   98 F (36.7 C)   TempSrc:   Axillary   Resp: 17 17  19   Height:      Weight:      SpO2: 96% 94%  92%    Intake/Output Summary (Last 24 hours) at 12/28/11 1458 Last data filed at 12/28/11 1400  Gross per 24 hour  Intake    250 ml  Output   1600 ml  Net  -1350 ml    Exam:   General:  Pt is sleeping, opens eyes when called by name, on BiPAP  Cardiovascular: Regular rate and rhythm, S1/S2, no murmurs, no rubs, no gallops  Respiratory: Decreased breath sounds bilaterally, with mild end expiratory wheezing   Abdomen: Soft, non tender, non distended, bowel sounds present, no guarding  Extremities: Bilateral pitting edema + 1, pulses DP and PT palpable bilaterally  Neuro: Grossly nonfocal  Data Reviewed: Basic Metabolic Panel:  Lab 12/27/11 6440  NA 139  K 4.9  CL 103  CO2 30  GLUCOSE 104*  BUN 10  CREATININE 0.81  CALCIUM 9.3  MG --  PHOS --   CBC:  Lab 12/27/11 1040  WBC 5.1  NEUTROABS 2.6  HGB 14.4  HCT 42.6  MCV 95.7  PLT 179   Cardiac Enzymes:  Lab 12/27/11 1145  CKTOTAL --  CKMB --  CKMBINDEX --  TROPONINI <0.30    Recent Results (from the past 240 hour(s))  MRSA PCR SCREENING     Status: Normal   Collection Time   12/28/11  3:27 AM      Component Value Range  Status Comment   MRSA by PCR NEGATIVE  NEGATIVE Final      Scheduled Meds:   . [COMPLETED] albuterol  2.5 mg Nebulization Once  . albuterol  2.5 mg Nebulization Q4H  . azithromycin  500 mg Intravenous Q24H  . enoxaparin  injection  40 mg Subcutaneous Q24H  . furosemide  20 mg Intravenous Q12H  . ipratropium  0.5 mg Nebulization Q4H  . methylPREDNISolone  inj  60 mg Intravenous Q6H  . senna-docusate  1 tablet Oral BID   Continuous Infusions:    Debbora Presto, MD  [facility] Pager [phone]  If 7PM-7AM, please contact night-coverage [email or web address] Password [email or web address] 12/28/2011, 2:58 PM   LOS: 1 day

## 2011-12-28 NOTE — Progress Notes (Signed)
Event: 0205:  RN notified that pt had been sleeping very soundly and at times sats would drop into 80's w/ rapid return to baseline when he is awakened. Pt then found sleeping soundly w/ snoring type respirations, diaphoretic and difficult to arouse. Pt noted to have desated into 70's. ABG ordered. NP to bedside.  Subjective: Pt denies any c/o's when awakened. Seems somewhat confused. Admits to being told in the past that he has sleep apnea but does not answer when I asked if he is supposed to be on CPAP at night.  Objective: 46 y/o morbidly obese AA male admitted on 12/27/11 for progressive SOB, OSA, morbid obesity and HTN. At bedside noted pt snoring loudly. Does open eyes when spoken to but remains lethargic even when awake. Current VS T-98.2, BP-158/94, P-90, R-21, w/ 02 sats of 98% on venturi mask at 50%. BBS w/ coarse rhonchi. HR-90 w/ RRR. Pt has effective cough. ABG reveals pH-7.27, pC02-65.8, p02-78.5, Bicarb-29.7.  Assessment/Plan: 1. Respiratory failure w/ hypercapnia and hypoxia in clinical setting of morbidly obese pt w/ h/o OSA. Will transfer to SDU and initaite Bi-PAP. Will repeat ABG approx 2 hours after starting Bi-PAP and continue to monitor closely.  Leanne Chang, NP-C [facility] Pager [phone]

## 2011-12-28 NOTE — Progress Notes (Signed)
Foley catheter inserted at 1044 am. No difficulty with insertion. Pt tolerated well. 1115 am catheter drainage noted to be blood tinged. MD notified. Will continue to monitor. Geroge Baseman

## 2011-12-28 NOTE — Progress Notes (Signed)
  Echocardiogram 2D Echocardiogram has been performed.  Jorje Guild 12/28/2011, 12:46 PM

## 2011-12-29 LAB — BLOOD GAS, ARTERIAL
Bicarbonate: 31.7 mEq/L — ABNORMAL HIGH (ref 20.0–24.0)
Drawn by: 295031
Patient temperature: 98.6
TCO2: 27.8 mmol/L (ref 0–100)
pH, Arterial: 7.397 (ref 7.350–7.450)

## 2011-12-29 LAB — CBC
MCH: 30.8 pg (ref 26.0–34.0)
MCHC: 31.6 g/dL (ref 30.0–36.0)
Platelets: 165 10*3/uL (ref 150–400)
RDW: 15.1 % (ref 11.5–15.5)

## 2011-12-29 LAB — BASIC METABOLIC PANEL
Calcium: 9.6 mg/dL (ref 8.4–10.5)
GFR calc non Af Amer: >90 mL/min (ref >90)
Glucose, Bld: 204 mg/dL — ABNORMAL HIGH (ref 70–99)
Sodium: 136 mEq/L (ref 135–145)

## 2011-12-29 MED ORDER — FUROSEMIDE 40 MG PO TABS
40.0000 mg | ORAL_TABLET | Freq: Two times a day (BID) | ORAL | Status: DC
  Administered 2011-12-29 – 2012-01-01 (×7): 40 mg via ORAL
  Filled 2011-12-29 (×9): qty 1

## 2011-12-29 MED ORDER — AZITHROMYCIN 500 MG PO TABS
500.0000 mg | ORAL_TABLET | Freq: Every day | ORAL | Status: DC
Start: 2011-12-30 — End: 2012-01-01
  Administered 2011-12-30 – 2012-01-01 (×3): 500 mg via ORAL
  Filled 2011-12-29 (×3): qty 1

## 2011-12-29 MED ORDER — METHYLPREDNISOLONE SODIUM SUCC 125 MG IJ SOLR
60.0000 mg | Freq: Two times a day (BID) | INTRAMUSCULAR | Status: DC
  Administered 2011-12-29 – 2011-12-30 (×2): 60 mg via INTRAVENOUS
  Filled 2011-12-29 (×4): qty 0.96

## 2011-12-29 NOTE — Progress Notes (Signed)
Patient ID: Aaron Bishop, male   DOB: Nov 03, 1965, 46 y.o.   MRN: [id number]  [facility] PROGRESS NOTE  ELAD BUR WUJ:[id number] DOB: January 11, 1966 DOA: 12/27/2011 PCP: No primary provider on file.  Brief narrative:  Pt is 46 y/o morbidly obese male with hx of HTN presented to ED with progressively worsening resting and exertional SOB that initially started 3 weeks prior to admission, associated with productive cough of yellow sputum, wheezing, subjective fevers and chills. Patient denies similar episodes in the past. He endorses one pack per day smoking history since age of 64. In emergency department patient desaturated to low 80%'s and has required admission to stepdown unit for further evaluation.   Principal Problem:  *Respiratory failure with hypercapnia and hypoxia  - Based on clinical symptoms and imaging tests this is most likely secondary to acute exacerbation of COPD  - Transfer the pt to telemetry floor - pt maintaining good oxygen saturations on [place] - Continue supportive care with nebulizers scheduled and as needed, empiric antibiotics azithromycin - will lower the dose of Solumedrol today  - Patient will need pulmonary function tests eventually upon discharge  Active Problems:  OSA (obstructive sleep apnea)  - Patient is not aware of diagnosis of obstructive sleep apnea but is very likely given body habitus  - will eventually need sleep study upon discharge Morbid obesity  - diet recommendations provided - BMI greater than 50  Hypertension  - d/c Lisinopril due to cough Bilateral leg edema  - Likely secondary to congestive heart failure  - We'll continue Lasix but will change to PO - 2D ECHO with normal systolic dysfunction, EF 60-65%  Consultants:  None  Procedures/Studies:  Dg Chest 2 View  12/27/2011  IMPRESSION:  Study is markedly limited by patient's large body habitus. Cardiomegaly is noted. Lung bases are obscured by overlying chest wall tissue. I  cannot exclude bilateral basilar atelectasis or infiltrate. There is no convincing pulmonary edema. No diagnostic pneumothorax.   Antibiotics:  Zithromax 11/12 -->  Code Status: Full  Family Communication: Mother at bedside  Disposition Plan: Home when medically stable    HPI/Subjective: No events overnight.   Objective: Filed Vitals:   12/29/11 0130 12/29/11 0300 12/29/11 0400 12/29/11 0426  BP:   140/57   Pulse: 66 64 62 65  Temp:      TempSrc:      Resp: 27 20 34 20  Height:      Weight:      SpO2: 94% 95% 94% 95%    Intake/Output Summary (Last 24 hours) at 12/29/11 0640 Last data filed at 12/29/11 0500  Gross per 24 hour  Intake    985 ml  Output   2250 ml  Net  -1265 ml    Exam:   General:  Pt is alert, follows commands appropriately, not in acute distress  Cardiovascular: Regular rate and rhythm, S1/S2, no murmurs, no rubs, no gallops  Respiratory: Decreased breath sounds bilaterally, no wheezing  Abdomen: Soft, non tender, non distended, bowel sounds present, no guarding  Extremities: +1 bilateral pitting edema, pulses DP and PT palpable bilaterally  Neuro: Grossly nonfocal  Data Reviewed: Basic Metabolic Panel:  Lab 12/29/11 9562 12/27/11 1145  NA 136 139  K 4.9 4.9  CL 98 103  CO2 33* 30  GLUCOSE 204* 104*  BUN 14 10  CREATININE 0.81 0.81  CALCIUM 9.6 9.3  MG -- --  PHOS -- --   CBC:  Lab 12/29/11 0359 12/27/11 1040  WBC 13.3* 5.1  NEUTROABS -- 2.6  HGB 13.9 14.4  HCT 44.0 42.6  MCV 97.3 95.7  PLT 165 179   Cardiac Enzymes:  Lab 12/27/11 1145  CKTOTAL --  CKMB --  CKMBINDEX --  TROPONINI <0.30   BNP: No components found with this basename: POCBNP:5 CBG: No results found for this basename: GLUCAP:5 in the last 168 hours  Recent Results (from the past 240 hour(s))  MRSA PCR SCREENING     Status: Normal   Collection Time   12/28/11  3:27 AM      Component Value Range Status Comment   MRSA by PCR NEGATIVE  NEGATIVE Final       Scheduled Meds:   . albuterol  2.5 mg Nebulization Q4H  . antiseptic oral rinse  15 mL Mouth Rinse q12n4p  . azithromycin  500 mg Intravenous Q24H  . chlorhexidine  15 mL Mouth Rinse BID  . enoxaparin (LOVENOX) injection  40 mg Subcutaneous Q24H  . furosemide  20 mg Intravenous Q12H  . ipratropium  0.5 mg Nebulization Q4H  . methylPREDNISolone (SOLU-MEDROL) injection  60 mg Intravenous Q6H  . senna-docusate  1 tablet Oral BID  . sodium chloride  3 mL Intravenous Q12H  . [DISCONTINUED] antiseptic oral rinse  15 mL Mouth Rinse BID  . [DISCONTINUED] azithromycin  500 mg Oral Daily  . [DISCONTINUED] enoxaparin (LOVENOX) injection  100 mg Subcutaneous Q24H  . [DISCONTINUED] furosemide  40 mg Intravenous Q12H  . [DISCONTINUED] ipratropium  0.5 mg Nebulization TID  . [DISCONTINUED] lisinopril  40 mg Oral Daily   Continuous Infusions:    Debbora Presto, MD  [facility] Pager [phone]  If 7PM-7AM, please contact night-coverage [email or web address] Password [email or web address] 12/29/2011, 6:40 AM   LOS: 2 days

## 2011-12-29 NOTE — Progress Notes (Signed)
PHARMACIST - PHYSICIAN COMMUNICATION DR:  [facility] CONCERNING: Antibiotic IV to Oral Route Change Policy  RECOMMENDATION: This patient is receiving azithromycin by the intravenous route.  Based on criteria approved by the Pharmacy and Therapeutics Committee, the antibiotic(s) is/are being converted to the equivalent oral dose form(s).   DESCRIPTION: These criteria include:  Patient being treated for a respiratory tract infection, urinary tract infection, or cellulitis  The patient is not neutropenic and does not exhibit a GI malabsorption state  The patient is eating (either orally or via tube) and/or has been taking other orally administered medications for a least 24 hours  The patient is improving clinically and has a Tmax < 100.5  If you have questions about this conversion, please contact the Pharmacy Department  []   [phone] )  [facility] []   [phone] )  [facility]  []   [phone] )  [facility] [x]   [phone] )  [facility]    thanks, Juliette Alcide, PharmD, BCPS.   Pager: [phone] 12/29/2011 11:57 AM

## 2011-12-30 LAB — CBC
MCH: 31.1 pg (ref 26.0–34.0)
MCV: 97.8 fL (ref 78.0–100.0)
Platelets: 179 10*3/uL (ref 150–400)
RDW: 15.1 % (ref 11.5–15.5)

## 2011-12-30 LAB — BASIC METABOLIC PANEL
Calcium: 9.5 mg/dL (ref 8.4–10.5)
Creatinine, Ser: 0.81 mg/dL (ref 0.50–1.35)
GFR calc Af Amer: >90 mL/min (ref >90)
GFR calc non Af Amer: >90 mL/min (ref >90)
Sodium: 135 mEq/L (ref 135–145)

## 2011-12-30 MED ORDER — TIOTROPIUM BROMIDE MONOHYDRATE 18 MCG IN CAPS
18.0000 ug | ORAL_CAPSULE | Freq: Every day | RESPIRATORY_TRACT | Status: DC
  Administered 2011-12-30 – 2012-01-01 (×3): 18 ug via RESPIRATORY_TRACT
  Filled 2011-12-30: qty 5

## 2011-12-30 MED ORDER — PREDNISONE 10 MG PO TABS: 10.0000 mg | ORAL_TABLET | Freq: Every day | ORAL | Status: DC

## 2011-12-30 MED ORDER — PREDNISONE 50 MG PO TABS
50.0000 mg | ORAL_TABLET | Freq: Every day | ORAL | Status: AC
  Administered 2011-12-31: 50 mg via ORAL
  Filled 2011-12-30: qty 1

## 2011-12-30 MED ORDER — FLUTICASONE PROPIONATE 50 MCG/ACT NA SUSP
2.0000 | Freq: Two times a day (BID) | NASAL | Status: DC
  Administered 2011-12-30 – 2012-01-01 (×6): 2 via NASAL
  Filled 2011-12-30: qty 16

## 2011-12-30 MED ORDER — PREDNISONE 50 MG PO TABS: 50.0000 mg | ORAL_TABLET | Freq: Every day | ORAL | Status: DC

## 2011-12-30 MED ORDER — PREDNISONE 20 MG PO TABS: 20.0000 mg | ORAL_TABLET | Freq: Every day | ORAL | Status: DC

## 2011-12-30 MED ORDER — PREDNISONE 20 MG PO TABS
40.0000 mg | ORAL_TABLET | Freq: Every day | ORAL | Status: AC
  Administered 2012-01-01: 40 mg via ORAL
  Filled 2011-12-30 (×2): qty 2

## 2011-12-30 MED ORDER — FLUTICASONE PROPIONATE HFA 220 MCG/ACT IN AERO
2.0000 | INHALATION_SPRAY | Freq: Two times a day (BID) | RESPIRATORY_TRACT | Status: DC
  Administered 2011-12-30 – 2012-01-01 (×5): 2 via RESPIRATORY_TRACT
  Filled 2011-12-30: qty 12

## 2011-12-30 MED ORDER — IPRATROPIUM BROMIDE 0.02 % IN SOLN
0.5000 mg | RESPIRATORY_TRACT | Status: DC | PRN
Start: 2011-12-30 — End: 2012-01-01
  Administered 2011-12-30: 0.5 mg via RESPIRATORY_TRACT
  Filled 2011-12-30: qty 2.5

## 2011-12-30 MED ORDER — PREDNISONE 20 MG PO TABS
30.0000 mg | ORAL_TABLET | Freq: Every day | ORAL | Status: DC
  Filled 2011-12-30 (×2): qty 1

## 2011-12-30 NOTE — Care Management Note (Signed)
    Page 1 of 2   01/02/2012     1:31:16 PM   CARE MANAGEMENT NOTE 01/02/2012  Patient:  Aaron Bishop, Aaron Bishop   Account Number:  [id number]  Date Initiated:  12/28/2011  Documentation initiated by:  DAVIS,RHONDA  Subjective/Objective Assessment:   patient admitted with dyspnea desats down into the low70's was transferred to sdu for application of bipap sda.     Action/Plan:   home   Anticipated DC Date:  01/01/2012   Anticipated DC Plan:  HOME/SELF CARE  In-house referral  Financial Counselor      DC Planning Services  CM consult  Medication Assistance  [facility]      Choice offered to / List presented to:     DME arranged  [facility]      DME agency  [facility]        Status of service:  Completed, signed off Medicare Important Message given?   (If response is "NO", the following Medicare IM given date fields will be blank) Date Medicare IM given:   Date Additional Medicare IM given:    Discharge Disposition:  HOME/SELF CARE  Per UR Regulation:  Reviewed for med. necessity/level of care/duration of stay  If discussed at Long Length of Stay Meetings, dates discussed:    Comments:  01/01/12 Received call from Dr. Brien Few, patient will need home O2 at 2L and a follow up appointment with a pulmonologist such as Dr. Vassie Loll at earliest date possible. Dr. Brien Few stated that [facility] was not needed. Contacted Germaine at [facility] and requested home O2. Placed CM consult to make follow up appt with pulmonologist if possible due to patient uninsured. Jacquelynn Cree RN, BSN, CCM   12/30/11 Cameren Earnest RN,BSN  NCM [phone] PATIENT QUALIFIES FOR INDIGENT FUNDS.INFORMED PATIENT OF PHARMACY POLICY-3 DY SUPPLY/NO NARCOTICS/1X USE FOR 1YEAR.WILL NEED 2 SETS OF SCRIPTS-1 FOR PATIENT TO CARRY/1 FOR PHARMACY.NOTED PATIENT ON ADVAIR IN HOUSE,MAY NEED INSTRUCTION PRIOR TO D/C.IF NEB MACHINE NEEDED FOR HOME WILL NEED ORDER-[facility] DME([facility]) ALREADY AWARE & FOLLOWING.PROVIDED  PATIENT W/PCP LISTING/COMMUNITY RESOURCES,& $4 [facility] MED LIST.HOPEFUL MOST OF MEDS WILL BE ON [facility] LIST.SEEN BY FINANCIAL COUNSELOR.IF HOME 02 NEEDED WILL NEED 02 SATS @ REST,AMBULATING,THEN ON 02 IN PROGRESS NOTES.

## 2011-12-30 NOTE — Progress Notes (Signed)
Patient ID: Aaron Bishop, male   DOB: March 02, 1965, 46 y.o.   MRN: [id number]  [facility] PROGRESS NOTE  NOAL ABSHIER [id number]:[id number] DOB: 1965/05/15 DOA: 12/27/2011 PCP: No primary provider on file.  Brief narrative: Pt is 46 y/o morbidly obese male with hx of HTN presented to ED with progressively worsening resting and exertional SOB that initially started 3 weeks prior to admission, associated with productive cough of yellow sputum, wheezing, subjective fevers and chills. Patient denies similar episodes in the past. He endorses one pack per day smoking history since age of 83. In emergency department patient desaturated to low 80%'s and has required admission to stepdown unit for further evaluation.   Principal Problem:  *Respiratory failure with hypercapnia and hypoxia  - Based on clinical symptoms and imaging tests this is most likely secondary to acute exacerbation of COPD  - pt maintaining good oxygen saturations on [place]  - Continue supportive care with nebulizers scheduled and as needed, empiric antibiotics azithromycin  - d/c Solumedrol as no wheezing noted on physical exam - start prednisone - Patient will need pulmonary function tests eventually upon discharge  Active Problems:  OSA (obstructive sleep apnea)  - Patient is not aware of diagnosis of obstructive sleep apnea but is very likely given body habitus  - will eventually need sleep study upon discharge  Morbid obesity  - diet recommendations provided  - BMI greater than 50  Hypertension  - d/c Lisinopril due to cough  Bilateral leg edema  - Likely secondary to congestive heart failure  - continue Lasix - 2D ECHO with normal systolic dysfunction, EF 60-65%   Consultants:  None  Procedures/Studies:  Dg Chest 2 View  12/27/2011  IMPRESSION:  Study is markedly limited by patient's large body habitus. Cardiomegaly is noted. Lung bases are obscured by overlying chest wall tissue. I cannot exclude bilateral  basilar atelectasis or infiltrate. There is no convincing pulmonary edema. No diagnostic pneumothorax.   Antibiotics:  Zithromax 11/12 -->  Code Status: Full  Family Communication: Mother at bedside  Disposition Plan: Home likely in 1-2 days   HPI/Subjective: No events overnight.   Objective: Filed Vitals:   12/30/11 0421 12/30/11 0540 12/30/11 0757 12/30/11 1138  BP:  153/77    Pulse:  67    Temp:  98.6 F (37 C)    TempSrc:  Oral    Resp:  16    Height:      Weight:      SpO2: 96% 99% 100% 93%    Intake/Output Summary (Last 24 hours) at 12/30/11 1315 Last data filed at 12/30/11 0900  Gross per 24 hour  Intake    600 ml  Output   2827 ml  Net  -2227 ml    Exam:   General:  Pt is alert, follows commands appropriately, not in acute distress  Cardiovascular: Regular rate and rhythm, S1/S2, no murmurs, no rubs, no gallops  Respiratory: Decreased breath sounds bilaterally, mostly due to body habitus, no wheezing   Abdomen: Soft, non tender, non distended, bowel sounds present, no guarding  Extremities: +1 bilateral pitting edema, pulses DP and PT palpable bilaterally  Neuro: Grossly nonfocal  Data Reviewed: Basic Metabolic Panel:  Lab 12/30/11 5784 12/29/11 0359 12/27/11 1145  NA 135 136 139  K 4.9 4.9 4.9  CL 95* 98 103  CO2 34* 33* 30  GLUCOSE 173* 204* 104*  BUN 17 14 10   CREATININE 0.81 0.81 0.81  CALCIUM 9.5 9.6 9.3  MG -- -- --  PHOS -- -- --   CBC:  Lab 12/30/11 0450 12/29/11 0359 12/27/11 1040  WBC 10.6* 13.3* 5.1  NEUTROABS -- -- 2.6  HGB 14.2 13.9 14.4  HCT 44.7 44.0 42.6  MCV 97.8 97.3 95.7  PLT 179 165 179   Cardiac Enzymes:  Lab 12/27/11 1145  CKTOTAL --  CKMB --  CKMBINDEX --  TROPONINI <0.30     Recent Results (from the past 240 hour(s))  MRSA PCR SCREENING     Status: Normal   Collection Time   12/28/11  3:27 AM      Component Value Range Status Comment   MRSA by PCR NEGATIVE  NEGATIVE Final      Scheduled Meds:     . albuterol  2.5 mg Nebulization Q4H  . antiseptic oral rinse  15 mL Mouth Rinse q12n4p  . azithromycin  500 mg Oral Daily  . chlorhexidine  15 mL Mouth Rinse BID  . enoxaparin (LOVENOX) injection  40 mg Subcutaneous Q24H  . fluticasone  2 spray Each Nare BID  . fluticasone  2 puff Inhalation BID  . furosemide  40 mg Oral BID  . methylPREDNISolone (SOLU-MEDROL) injection  60 mg Intravenous Q12H  . senna-docusate  1 tablet Oral BID  . sodium chloride  3 mL Intravenous Q12H  . tiotropium  18 mcg Inhalation Daily  . [DISCONTINUED] ipratropium  0.5 mg Nebulization Q4H   Continuous Infusions:    Debbora Presto, MD  [facility] Pager [phone]  If 7PM-7AM, please contact night-coverage [email or web address] Password [email or web address] 12/30/2011, 1:15 PM   LOS: 3 days

## 2011-12-30 NOTE — Progress Notes (Signed)
Patient ambulated on room air in the hall oxygen saturation down to 87%-RA, at rest after ambulation 93-96%-RA

## 2011-12-31 DIAGNOSIS — J9600 Acute respiratory failure, unspecified whether with hypoxia or hypercapnia: Secondary | ICD-10-CM

## 2011-12-31 DIAGNOSIS — G4733 Obstructive sleep apnea (adult) (pediatric): Secondary | ICD-10-CM

## 2011-12-31 MED ORDER — ALBUTEROL SULFATE (5 MG/ML) 0.5% IN NEBU
2.5000 mg | INHALATION_SOLUTION | Freq: Four times a day (QID) | RESPIRATORY_TRACT | Status: DC
  Administered 2011-12-31 – 2012-01-01 (×3): 2.5 mg via RESPIRATORY_TRACT
  Filled 2011-12-31 (×4): qty 0.5

## 2011-12-31 MED ORDER — NICOTINE 14 MG/24HR TD PT24
14.0000 mg | MEDICATED_PATCH | Freq: Every day | TRANSDERMAL | Status: DC
  Administered 2011-12-31 – 2012-01-01 (×2): 14 mg via TRANSDERMAL
  Filled 2011-12-31 (×2): qty 1

## 2011-12-31 NOTE — Progress Notes (Signed)
Up and ambulated entire length of hall with standby assist of CNA.  HR ntd to increase to 130's during ambulation, ST, non-sustained.  Sat up in chair approx. 30 min. After walking, with HR decreasing back to low 100's.  States generally feels better than when first came in hospital. "Breathing is better and my feet and hands have really gone down".

## 2011-12-31 NOTE — Progress Notes (Signed)
Patient refuses CPAP at this time. States that he can not sleep with it on. RT encouraged to call if he changed his mind.

## 2011-12-31 NOTE — Progress Notes (Signed)
[facility] PROGRESS NOTE  Aaron Bishop ZOX:[id number] DOB: 10-28-1965 DOA: 12/27/2011 PCP: No primary provider on file.  Assessment/Plan: Principal Problem:  *Shortness of breath Active Problems:  OSA (obstructive sleep apnea)  Morbid obesity  Hypertension  Bilateral leg edema  Respiratory failure with hypercapnia  Respiratory failure with hypoxia    1. Respiratory failure with hypercapnia and hypoxia: Patient presented with progressive dyspnea, as well as a productive cough, against a background of morbid obesity and tobacco abuse, and ABGs have confirmed type 2 respiratory failure. Based on clinical symptoms and imaging studies, the main culprit is acute exacerbation of COPD, due to acute bronchitis, but other factors, include obesity/OHS, right heart failure and possible OSA. Managing with steroids, Oxygen supplementation, nebulizers and Azithromycin, with satisfactory clinical response. Patient is now maintaining good oxygen saturations on [place]. Patient will definitely need pulmonologist follow up on discharge.  2. OSA (obstructive sleep apnea):   This very likely, given body habitus and snoring. Patient will benefit from outpatient sleep study.   3. Morbid obesity:   BMI greater than 50. Weight loss and appropriate diet recommended.   4. Hypertension:   BP is controlled at this time. Lisinopril was discontinued due to cough.   5. Bilateral leg edema  This is likely secondary to right heart failure, and is responding satisfactorily to Lasix. 2D Echocardiogram revealed normal systolic dysfunction, EF 60-65%.  6. Tobacco Abuse: Patient unfortunately, continues to smoke. He has been counseled appropriately, and placed on Nicoderm CQ.    Code Status: Full Code.  Family Communication:  Disposition Plan: Aiming discharge on 01/01/12. .    Brief narrative: 46 y/o morbidly obese male with hx of HTN presenting to ED withe progressive SOB since 3 weeks. Patient had worsening of  his symptoms with dyspnea at rest along with orthopnea and PND for prior 7-10 days, a cough with whitish phlegm and became more wheezy. Denies any chest pain, palpitations, nausea, vomiting, fever, or chills. Denies any bowel or urinary symptoms. He has leg swelling off and on and was prescribed HCTZ as oupatient but hasn't taken it since 3 months. Patient continues to smoke 6-7 cigarettes daily for past several years. In the ED he was noted to desaturate to the low 80s on RA and placed on nasal canula. CXR was negative for pulm edema and showed some atelectasis. Pro BNP was low. Patient was admitted for further management.    Consultants:  N/A  Procedures:  CXR.   Antibiotics:  Azithromycin 12/27/11>>>  HPI/Subjective: Feels much better, and phlegm is clearer.   Objective: Vital signs in last 24 hours: Temp:  [98 F (36.7 C)-98.6 F (37 C)] 98.6 F (37 C) (11/16 1325) Pulse Rate:  [89-114] 89  (11/16 1325) Resp:  [17-24] 17  (11/16 1325) BP: (128-166)/(68-76) 135/76 mmHg (11/16 1325) SpO2:  [96 %-99 %] 98 % (11/16 1325) Weight change:  Last BM Date: 12/29/11  Intake/Output from previous day: 11/15 0701 - 11/16 0700 In: 840 [P.O.:840] Out: 3176 [Urine:3175; Stool:1] Total I/O In: 240 [P.O.:240] Out: -    Physical Exam: General: Comfortable, alert, communicative, fully oriented, not short of breath at rest.  HEENT:  No clinical pallor, no jaundice, no conjunctival injection or discharge. NECK:  Supple, JVP not seen, no carotid bruits, no palpable lymphadenopathy, no palpable goiter. CHEST:  Clinically clear to auscultation, no wheezes, no crackles. HEART:  Sounds 1 and 2 heard, normal, regular, no murmurs. ABDOMEN:  Morbidly obese, soft, non-tender, no palpable organomegaly, no palpable  masses, normal bowel sounds. GENITALIA:  Not examined. LOWER EXTREMITIES:  Moderate pitting edema, palpable peripheral pulses. MUSCULOSKELETAL SYSTEM:  Unremarkable. CENTRAL NERVOUS  SYSTEM:  No focal neurologic deficit on gross examination.  Lab Results:  [facility] 12/30/11 0450 12/29/11 0359  WBC 10.6* 13.3*  HGB 14.2 13.9  HCT 44.7 44.0  PLT 179 165    Basename 12/30/11 0450 12/29/11 0359  NA 135 136  K 4.9 4.9  CL 95* 98  CO2 34* 33*  GLUCOSE 173* 204*  BUN 17 14  CREATININE 0.81 0.81  CALCIUM 9.5 9.6   Recent Results (from the past 240 hour(s))  MRSA PCR SCREENING     Status: Normal   Collection Time   12/28/11  3:27 AM      Component Value Range Status Comment   MRSA by PCR NEGATIVE  NEGATIVE Final      Studies/Results: No results found.  Medications: Scheduled Meds:   . albuterol  2.5 mg Nebulization Q4H  . antiseptic oral rinse  15 mL Mouth Rinse q12n4p  . azithromycin  500 mg Oral Daily  . chlorhexidine  15 mL Mouth Rinse BID  . enoxaparin (LOVENOX) injection  40 mg Subcutaneous Q24H  . fluticasone  2 spray Each Nare BID  . fluticasone  2 puff Inhalation BID  . furosemide  40 mg Oral BID  . [COMPLETED] predniSONE  50 mg Oral Q breakfast   Followed by  . predniSONE  40 mg Oral Q breakfast   Followed by  . predniSONE  30 mg Oral Q breakfast   Followed by  . predniSONE  20 mg Oral Q breakfast   Followed by  . predniSONE  10 mg Oral Q breakfast  . senna-docusate  1 tablet Oral BID  . sodium chloride  3 mL Intravenous Q12H  . tiotropium  18 mcg Inhalation Daily   Continuous Infusions:  PRN Meds:.sodium chloride, albuterol, guaiFENesin-dextromethorphan, ipratropium, nitroGLYCERIN, sodium chloride    LOS: 4 days   Vayden Bishop,Aaron  [facility] Pager [phone]. If 8PM-8AM, please contact night-coverage at [email or web address], password [facility] 12/31/2011, 3:17 PM  LOS: 4 days

## 2011-12-31 NOTE — Progress Notes (Signed)
Pt refused CPAP for the night, RT to monitor and assess as needed.  

## 2012-01-01 LAB — CBC
Platelets: 155 10*3/uL (ref 150–400)
RBC: 4.81 MIL/uL (ref 4.22–5.81)
RDW: 15 % (ref 11.5–15.5)
WBC: 8.1 10*3/uL (ref 4.0–10.5)

## 2012-01-01 LAB — BASIC METABOLIC PANEL
CO2: 33 mEq/L — ABNORMAL HIGH (ref 19–32)
Chloride: 97 mEq/L (ref 96–112)
GFR calc Af Amer: >90 mL/min (ref >90)
Sodium: 136 mEq/L (ref 135–145)

## 2012-01-01 MED ORDER — FLUTICASONE PROPIONATE HFA 220 MCG/ACT IN AERO: 2.0000 | INHALATION_SPRAY | Freq: Two times a day (BID) | RESPIRATORY_TRACT | Status: DC

## 2012-01-01 MED ORDER — POTASSIUM CHLORIDE ER 10 MEQ PO TBCR: 20.0000 meq | EXTENDED_RELEASE_TABLET | Freq: Every day | ORAL | Status: DC

## 2012-01-01 MED ORDER — PREDNISONE 10 MG PO TABS: ORAL_TABLET | ORAL | Status: DC

## 2012-01-01 MED ORDER — AZITHROMYCIN 500 MG PO TABS: 500.0000 mg | ORAL_TABLET | Freq: Every day | ORAL | Status: DC

## 2012-01-01 MED ORDER — ALBUTEROL SULFATE (5 MG/ML) 0.5% IN NEBU: 2.5000 mg | INHALATION_SOLUTION | RESPIRATORY_TRACT | Status: DC | PRN

## 2012-01-01 MED ORDER — FUROSEMIDE 40 MG PO TABS: 40.0000 mg | ORAL_TABLET | Freq: Two times a day (BID) | ORAL | Status: DC

## 2012-01-01 MED ORDER — TIOTROPIUM BROMIDE MONOHYDRATE 18 MCG IN CAPS: 18.0000 ug | ORAL_CAPSULE | Freq: Every day | RESPIRATORY_TRACT | Status: DC

## 2012-01-01 MED ORDER — NICOTINE 14 MG/24HR TD PT24: 1.0000 | MEDICATED_PATCH | Freq: Every day | TRANSDERMAL | Status: DC

## 2012-01-01 NOTE — Plan of Care (Signed)
Problem: Phase I Progression Outcomes Goal: Flu/PneumoVaccines if indicated Outcome: Not Applicable Date Met:  01/01/12 Pt refused

## 2012-01-01 NOTE — Plan of Care (Signed)
Problem: Phase II Progression Outcomes Goal: O2 sats > equal to 90% on RA or at baseline Outcome: Not Progressing O2 sat decreased with ambulation

## 2012-01-01 NOTE — Discharge Summary (Signed)
Physician Discharge Summary  Aaron Bishop:[id number] DOB: 03-22-1965 DOA: 12/27/2011  PCP: No primary provider on file.  Admit date: 12/27/2011 Discharge date: 01/01/2012  Time spent: 40  minutes  Recommendations for Outpatient Follow-up:  1. Follow up with Dr Cyril Mourning, Pulmonologist.  2. Outpatient Sleep Study.   Discharge Diagnoses:  Principal Problem:  *Shortness of breath Active Problems:  OSA (obstructive sleep apnea)  Morbid obesity  Hypertension  Bilateral leg edema  Respiratory failure with hypercapnia  Respiratory failure with hypoxia   Discharge Condition: Satisfactory.  Diet recommendation: Heart-Healthy.   Filed Weights   12/27/11 1712 12/28/11 0324  Weight: 197.768 kg (436 lb) 224.3 kg (494 lb 7.9 oz)    History of present illness:  46 y/o morbidly obese male with hx of HTN presenting to ED withe progressive SOB since 3 weeks. Patient had worsening of his symptoms with dyspnea at rest along with orthopnea and PND for prior 7-10 days, a cough with whitish phlegm and became more wheezy. Denies any chest pain, palpitations, nausea, vomiting, fever, or chills. Denies any bowel or urinary symptoms. He has leg swelling off and on and was prescribed HCTZ as oupatient but hasn't taken it since 3 months. Patient continues to smoke 6-7 cigarettes daily for past several years. In the ED he was noted to desaturate to the low 80s on RA and placed on nasal canula. CXR was negative for pulm edema and showed some atelectasis. Pro BNP was low. Patient was admitted for further management.    Hospital Course:  1. Respiratory failure with hypercapnia and hypoxia: Patient presented with progressive dyspnea, as well as a productive cough, against a background of morbid obesity and tobacco abuse. ABGs confirmed type 2 respiratory failure. Based on clinical symptoms and imaging studies, the main culprit is acute exacerbation of COPD, due to acute bronchitis, but other factors,  include obesity/OHS, right heart failure and possible OSA. Managed with steroids, Oxygen supplementation, nebulizers and Azithromycin, with satisfactory clinical response. As of 12/31/11, patient was maintaining good oxygen saturations on 2l, [place], however, on testing, saturations were at 94% on RA at rest, decreased to 85%-77% while ambulating on RA, and recovered to 95%, when 2L O2 was re-applied. Home Oxygen has been arranged, at 2L /min, continuous, via [place]. Patient will definitely need pulmonologist follow up on discharge. This has been arranged. 2. OSA (obstructive sleep apnea):  This very likely, given body habitus and history of snoring. Patient will benefit from outpatient sleep study. We have deferred this to Pulmonologist to arrange, on follow up.  3. Morbid obesity:  BMI greater than 50. Weight loss and appropriate diet recommended.  4. Hypertension:  Patient has a known history of HTN, and was on Lisinopril, pre-admission. BP remained controlled during this hospitalization, although Lisinopril was discontinued due to cough.  5. Bilateral leg edema  This is likely secondary to right heart failure, and responded satisfactorily to Lasix. 2D Echocardiogram revealed normal systolic dysfunction, EF 60-65%.  6. Tobacco Abuse: Patient unfortunately, continues to smoke. He has been counseled appropriately, and placed on Nicoderm CQ patch.     Procedures:  See below.   Consultations:  N/A.   Discharge Exam: Filed Vitals:   01/01/12 1010 01/01/12 1011 01/01/12 1012 01/01/12 1345  BP:    151/66  Pulse:    83  Temp:    98.2 F (36.8 C)  TempSrc:    Oral  Resp:    18  Height:      Weight:  SpO2: 85% 77% 95% 93%    General: Comfortable, alert, communicative, fully oriented, not short of breath at rest.  HEENT: No clinical pallor, no jaundice, no conjunctival injection or discharge.  NECK: Supple, JVP not seen, no carotid bruits, no palpable lymphadenopathy, no palpable goiter.    CHEST: Clinically clear to auscultation, no wheezes, no crackles.  HEART: Sounds 1 and 2 heard, normal, regular, no murmurs.  ABDOMEN: Morbidly obese, soft, non-tender, no palpable organomegaly, no palpable masses, normal bowel sounds.  GENITALIA: Not examined.  LOWER EXTREMITIES: Moderate pitting edema, palpable peripheral pulses.  MUSCULOSKELETAL SYSTEM: Unremarkable.  CENTRAL NERVOUS SYSTEM: No focal neurologic deficit on gross examination.  Discharge Instructions      Discharge Orders    Future Orders Please Complete By Expires   Diet - low sodium heart healthy      Increase activity slowly          Medication List     As of 01/01/2012  2:11 PM    STOP taking these medications         cephALEXin 500 MG capsule   Commonly known as: KEFLEX      hydrochlorothiazide 50 MG tablet   Commonly known as: HYDRODIURIL      lisinopril 40 MG tablet   Commonly known as: PRINIVIL,ZESTRIL      TAKE these medications         acetaminophen 500 MG tablet   Commonly known as: TYLENOL   Take 500 mg by mouth every 6 (six) hours as needed. pain      albuterol (5 MG/ML) 0.5% nebulizer solution   Commonly known as: PROVENTIL   Take 0.5 mLs (2.5 mg total) by nebulization every 4 (four) hours as needed for wheezing.      azithromycin 500 MG tablet   Commonly known as: ZITHROMAX   Take 1 tablet (500 mg total) by mouth daily.      fluticasone 220 MCG/ACT inhaler   Commonly known as: FLOVENT HFA   Inhale 2 puffs into the lungs 2 (two) times daily.      furosemide 40 MG tablet   Commonly known as: LASIX   Take 1 tablet (40 mg total) by mouth 2 (two) times daily.      loratadine 10 MG tablet   Commonly known as: CLARITIN   Take 10 mg by mouth daily.      nicotine 14 mg/24hr patch   Commonly known as: NICODERM CQ - dosed in mg/24 hours   Place 1 patch onto the skin daily.      potassium chloride 10 MEQ tablet   Commonly known as: K-DUR   Take 2 tablets (20 mEq total) by mouth  daily.      predniSONE 10 MG tablet   Commonly known as: DELTASONE   Take 40 mg daily for 3 days, then 30 mg daily for 3 days, then 20 mg daily for 3 days, then 10 mg daily for 3 days, then stop.      tiotropium 18 MCG inhalation capsule   Commonly known as: SPIRIVA   Place 1 capsule (18 mcg total) into inhaler and inhale daily.         Follow-up Information    Schedule an appointment as soon as possible for a visit with Oretha Milch., MD. (You will be contacted with time and date of appointment.)    Contact information:   [address] [place] [address] [phone]  The results of significant diagnostics from this hospitalization (including imaging, microbiology, ancillary and laboratory) are listed below for reference.    Significant Diagnostic Studies: Dg Chest 2 View  12/27/2011  *RADIOLOGY REPORT*  Clinical Data: Shortness of breath, asthma  CHEST - 2 VIEW  Comparison: 05/11/2009  Findings: Study is markedly limited by patient's large body habitus.  Cardiomegaly is noted.  Lung bases are obscured by overlying chest wall tissue.  I cannot exclude bilateral basilar atelectasis or infiltrate.  There is no convincing pulmonary edema. No diagnostic pneumothorax.  IMPRESSION: Study is markedly limited by patient's large body habitus. Cardiomegaly is noted.  Lung bases are obscured by overlying chest wall tissue.  I cannot exclude bilateral basilar atelectasis or infiltrate.  There is no convincing pulmonary edema.  No diagnostic pneumothorax.   Original Report Authenticated By: Natasha Mead, M.D.     Microbiology: Recent Results (from the past 240 hour(s))  MRSA PCR SCREENING     Status: Normal   Collection Time   12/28/11  3:27 AM      Component Value Range Status Comment   MRSA by PCR NEGATIVE  NEGATIVE Final      Labs: Basic Metabolic Panel:  Lab 01/01/12 1610 12/30/11 0450 12/29/11 0359 12/27/11 1145  NA 136 135 136 139  K 4.0 4.9 4.9 4.9  CL 97 95* 98  103  CO2 33* 34* 33* 30  GLUCOSE 174* 173* 204* 104*  BUN 14 17 14 10   CREATININE 0.71 0.81 0.81 0.81  CALCIUM 9.0 9.5 9.6 9.3  MG -- -- -- --  PHOS -- -- -- --   Liver Function Tests: No results found for this basename: AST:5,ALT:5,ALKPHOS:5,BILITOT:5,PROT:5,ALBUMIN:5 in the last 168 hours No results found for this basename: LIPASE:5,AMYLASE:5 in the last 168 hours No results found for this basename: AMMONIA:5 in the last 168 hours CBC:  Lab 01/01/12 0530 12/30/11 0450 12/29/11 0359 12/27/11 1040  WBC 8.1 10.6* 13.3* 5.1  NEUTROABS -- -- -- 2.6  HGB 14.9 14.2 13.9 14.4  HCT 47.1 44.7 44.0 42.6  MCV 97.9 97.8 97.3 95.7  PLT 155 179 165 179   Cardiac Enzymes:  Lab 12/27/11 1145  CKTOTAL --  CKMB --  CKMBINDEX --  TROPONINI <0.30   BNP: BNP (last 3 results)  Basename 12/27/11 1145  PROBNP 67.8   CBG: No results found for this basename: GLUCAP:5 in the last 168 hours     Signed:  Lamiya Naas,CHRISTOPHER  [facility] 01/01/2012, 2:11 PM

## 2012-01-01 NOTE — Progress Notes (Signed)
Pt at 94% off O2 prior to ambulation at rest.  Sat decreased to 85% while ambulating and then to 77% when back to bed without O2.  Applied O2 at 2l [place] and sat increased to 95%.   Nino Parsley

## 2012-02-02 ENCOUNTER — Ambulatory Visit (INDEPENDENT_AMBULATORY_CARE_PROVIDER_SITE_OTHER): Payer: Self-pay | Admitting: Pulmonary Disease

## 2012-02-02 ENCOUNTER — Encounter: Payer: Self-pay | Admitting: Pulmonary Disease

## 2012-02-02 VITALS — BP 130/70 | HR 78 | Temp 97.8°F | Ht 68.0 in | Wt >= 6400 oz

## 2012-02-02 DIAGNOSIS — G4733 Obstructive sleep apnea (adult) (pediatric): Secondary | ICD-10-CM

## 2012-02-02 MED ORDER — FUROSEMIDE 40 MG PO TABS: 40.0000 mg | ORAL_TABLET | Freq: Two times a day (BID) | ORAL | Status: DC

## 2012-02-02 MED ORDER — POTASSIUM CHLORIDE ER 10 MEQ PO TBCR: 10.0000 meq | EXTENDED_RELEASE_TABLET | Freq: Every day | ORAL | Status: DC

## 2012-02-02 NOTE — Assessment & Plan Note (Addendum)
Sleep study will be scheduled  Given excessive daytime somnolence, narrow pharyngeal exam, witnessed apneas & loud snoring, obstructive sleep apnea is very likely & an overnight polysomnogram will be scheduled as a split study. The pathophysiology of obstructive sleep apnea , it's cardiovascular consequences & modes of treatment including CPAP were discused with the patient in detail & they evidenced understanding. Due to presence of for chest BiPAP may very well be indicated

## 2012-02-02 NOTE — Assessment & Plan Note (Signed)
You have to stop smoking We will set you up with a machine after the study Refills on lasix & potassium Referral to dietician

## 2012-02-02 NOTE — Progress Notes (Signed)
Subjective:    Patient ID: Aaron Bishop, male    DOB: 1965/10/30, 46 y.o.   MRN: [id number]  HPI 46 y/o morbidly obese smoker,  with hx of HTN  with recent hospitalization from 11/12-17 for progressive SOB since 3 weeks and leg swelling . He was noted to be in acute hypercarbic respiratory failure with ABG of 7.28/66/78. This improved to 7.39/52/69 on discharge on 2 L of oxygen Patient continues to smoke 6-7 cigarettes daily for past several years.CXR was negative for pulm edema and showed some atelectasis. Pro BNP was low.  He was also treated as COPD flare with steroids, Oxygen supplementation, nebulizers and Azithromycin, with satisfactory clinical response.  Bilateral leg edema was felt to be due to right heart failure, and responded satisfactorily to Lasix. 2D Echocardiogram revealed normal systolic dysfunction, EF 60-65%.   Sleep history: He has been told he stops breathing at night, snores at night, feels tired during the day. he wears 2 liters of oxygen at night. Bedtime is 10 PM, sleep latency is 15 minutes, he sleeps on his side with 2 pillows. He is 4-5 awakenings without any post void sleep latency and is out of bed by 5 AM feeling tired with dryness of mouth and occasional headache. He gained 20 pounds prior to admission and has lost some due to diuresis.  Was placed on BiPAP during hospital stay and he had some problems exhaling but feels that he could adjust given the time. Epworth sleepiness score is 12/ 24. He is worried about gaining weight after smoking cessation  Past Medical History  Diagnosis Date  . Hypertension   . Morbid obesity   . Asthma   . Hyperlipidemia   . Respiratory failure     Past Surgical History  Procedure Date  . No past surgeries     No Known Allergies  History   Social History  . Marital Status: Single    Spouse Name: N/A    Number of Children: N/A  . Years of Education: N/A   Occupational History  . Not on file.   Social History Main  Topics  . Smoking status: Current Every Day Smoker -- 0.2 packs/day for 30 years    Types: Cigarettes  . Smokeless tobacco: Never Used  . Alcohol Use: Yes     Comment: 1 bottle per week  . Drug Use: No  . Sexually Active: Not on file   Other Topics Concern  . Not on file   Social History Narrative  . No narrative on file      Review of Systems  Constitutional: Negative for appetite change and unexpected weight change.  HENT: Positive for congestion. Negative for ear pain, sore throat, sneezing, trouble swallowing and dental problem.   Respiratory: Positive for shortness of breath. Negative for cough.   Cardiovascular: Positive for leg swelling. Negative for chest pain and palpitations.  Gastrointestinal: Positive for abdominal pain.  Musculoskeletal: Negative for joint swelling.  Skin: Negative for rash.  Neurological: Negative for headaches.  Psychiatric/Behavioral: Positive for dysphoric mood. The patient is not nervous/anxious.        Objective:   Physical Exam  Gen. Pleasant, obese, in no distress, normal affect, BMI > 50 ENT - no lesions, no post nasal drip, class 2-3 airway Neck: No JVD, no thyromegaly, no carotid bruits Lungs: no use of accessory muscles, no dullness to percussion, decreased without rales or rhonchi  Cardiovascular: Rhythm regular, heart sounds  normal, no murmurs or gallops, no peripheral  edema Abdomen: soft and non-tender, no hepatosplenomegaly, BS normal. Musculoskeletal: No deformities, no cyanosis or clubbing Neuro:  alert, non focal, no tremors       Assessment & Plan:

## 2012-02-02 NOTE — Patient Instructions (Addendum)
Sleep study will be scheduled You have to stop smoking We will set you up with a machine after the study Refills on lasix & potassium Referral to dietician

## 2012-02-23 ENCOUNTER — Encounter (HOSPITAL_BASED_OUTPATIENT_CLINIC_OR_DEPARTMENT_OTHER): Payer: Self-pay

## 2012-03-06 ENCOUNTER — Encounter (HOSPITAL_BASED_OUTPATIENT_CLINIC_OR_DEPARTMENT_OTHER): Payer: Self-pay

## 2012-03-09 ENCOUNTER — Ambulatory Visit: Payer: Self-pay | Admitting: Dietician

## 2012-03-16 ENCOUNTER — Ambulatory Visit: Payer: Self-pay | Admitting: Pulmonary Disease

## 2012-03-28 ENCOUNTER — Encounter (HOSPITAL_BASED_OUTPATIENT_CLINIC_OR_DEPARTMENT_OTHER): Payer: Self-pay

## 2012-03-30 ENCOUNTER — Ambulatory Visit (HOSPITAL_BASED_OUTPATIENT_CLINIC_OR_DEPARTMENT_OTHER): Payer: Self-pay | Attending: Pulmonary Disease

## 2012-03-30 VITALS — Ht 69.0 in | Wt >= 6400 oz

## 2012-03-30 DIAGNOSIS — G4733 Obstructive sleep apnea (adult) (pediatric): Secondary | ICD-10-CM

## 2012-04-04 DIAGNOSIS — G4710 Hypersomnia, unspecified: Secondary | ICD-10-CM

## 2012-04-04 DIAGNOSIS — G4730 Sleep apnea, unspecified: Secondary | ICD-10-CM

## 2012-04-05 ENCOUNTER — Telehealth: Payer: Self-pay | Admitting: Pulmonary Disease

## 2012-04-05 DIAGNOSIS — G4733 Obstructive sleep apnea (adult) (pediatric): Secondary | ICD-10-CM

## 2012-04-05 NOTE — Telephone Encounter (Signed)
Severe OSA on study Order has been sent to DME for autoCPAP 10-17 , med quattro mask, humidity,mdownload in 4 wks 2L o2 blended in

## 2012-04-05 NOTE — Procedures (Signed)
NAME:  Aaron Bishop, Aaron Bishop NO.:  [id number]  MEDICAL RECORD NO.:  [id number]          PATIENT TYPE:  OUT  LOCATION:  SLEEP CENTER                 FACILITY:  [facility]  PHYSICIAN:  Oretha Milch, MD      DATE OF BIRTH:  [birth year]  DATE OF STUDY:  03/30/2012                           NOCTURNAL POLYSOMNOGRAM  REFERRING PHYSICIAN:  Oretha Milch, MD  INDICATION FOR STUDY:  Aaron Bishop is a 47 year old morbidly obese gentleman with recent hospital admission for hypercarbic respiratory failure, where he was discharged on 2 L of oxygen.  He is also a smoker. At the time of this study, he weighed 473 pounds with a height of 5 feet 9 inches, BMI of 70, neck size of 18 inches.  EPWORTH SLEEPINESS SCORE:  15.  This intervention polysomnogram was performed with a sleep technologist in attendance.  EEG, EOG, EMG, EKG, and respiratory parameters were recorded.  Sleep stages, arousals, limb movements, and respiratory data were scored according to criteria laid out by the American Academy of Sleep Medicine.   SLEEP ARCHITECTURE:  Lights out was at 10:02 p.m., lights on was at 4:02 a.m.  CPAP was initiated at 26 minutes past midnight.  During the diagnostic portion, total sleep time was 121 minutes with a sleep period time of 138 minutes and a sleep efficiency of 85%.  Sleep latency was 5 minutes.  Latency to REM sleep was 111 minute and wake after sleep onset was 22 minutes.  Sleep stages of the percentage of total sleep time was N1 7%, N2 87%, N3 0%, and REM sleep 6% (7 minutes). Supine sleep accounted for 39 minutes.  REM sleep was noted in 2 long stages around 1-3 a.m.  During the titration portion, REM rebound was noted with 100 minutes of REM sleep.  RESPIRATORY DATA:  During the baseline portion, there were 225 obstructive apneas and 144 hypopneas with apnea-hypopnea index of 182 events per hour and a lowest desaturation of 56%.  Due to this degree of respiratory  disturbance, CPAP was initiated at 4 cm and titrated to a final level of 17 cm.  At a level of 16 cm for 50 minutes of non-REM sleep, 2 central apneas and 13 hypopneas were noted with an AHI of 18 events per hour and a lowest desaturation of 78%.  Most desaturations in the events were eliminated at 17 cm.  Arousal Data:  The arousal index during the diagnostic portion was 88 events per hour.  Most of these being related to respiratory events and during the titration portion, was 19 events per hour.  MOVEMENT-PARASOMNIA:  No significant limb movements were noted.  OXYGEN DATA:  The lowest desaturation was 56% during the diagnostic portion with a desaturation index of 155 events per hour.  During the titration portion, he spent 58 minutes with saturation less than 88%. No oxygen was applied during the study.  CARDIAC DATA:  Low heart rate was 42 beats per minute.  The high heart rate recorded was an artifact.  No arrhythmias were noted.  Discussion:  He was desensitized with a medium Quattro fullface mask. No oxygen was applied.  Titration appears to be optimal.  IMPRESSION: 1. Severe obstructive sleep apnea with hypopneas causing sleep     fragmentation and severe oxygen desaturation. 2. This was corrected by CPAP of 17 cm with a medium fullface mask. 3. No evidence of cardiac arrhythmias, limb movements, or behavioral     disturbance during sleep.  RECOMMENDATION: 1. The treatment options for this degree of sleep-disordered breathing     include weight loss and CPAP therapy. 2. CPAP should be initiated at 17 cm with medium fullface mask and     heated humidity compliance should be monitored at this level.  2 L     of oxygen can be continued.  The need for oxygen can be readdressed in     the future. 3. He should be advised against medications with sedative side     effects.  He should be cautioned against driving when sleepy.     Oretha Milch, MD    RVA/MEDQ  D:   04/04/2012 13:25:18  T:  04/05/2012 00:34:34  Job:  [id number]

## 2012-04-06 NOTE — Telephone Encounter (Signed)
I spoke with patient about results and he verbalized understanding and had no questions Order has been sent.  

## 2012-04-13 ENCOUNTER — Ambulatory Visit: Payer: Self-pay | Admitting: Pulmonary Disease

## 2012-04-24 ENCOUNTER — Other Ambulatory Visit: Payer: Self-pay | Admitting: Pulmonary Disease

## 2012-05-10 ENCOUNTER — Encounter: Payer: Self-pay | Admitting: Pulmonary Disease

## 2012-05-10 ENCOUNTER — Ambulatory Visit (INDEPENDENT_AMBULATORY_CARE_PROVIDER_SITE_OTHER): Payer: Self-pay | Admitting: Pulmonary Disease

## 2012-05-10 VITALS — BP 128/76 | HR 80 | Temp 98.1°F | Ht 69.0 in | Wt >= 6400 oz

## 2012-05-10 DIAGNOSIS — G4733 Obstructive sleep apnea (adult) (pediatric): Secondary | ICD-10-CM

## 2012-05-10 DIAGNOSIS — J9692 Respiratory failure, unspecified with hypercapnia: Secondary | ICD-10-CM

## 2012-05-10 DIAGNOSIS — J9600 Acute respiratory failure, unspecified whether with hypoxia or hypercapnia: Secondary | ICD-10-CM

## 2012-05-10 NOTE — Patient Instructions (Addendum)
Call DME to set up CPAP machine This will really help you You have severe obstructive sleep apnea  Stay on oxygen with CPAP for now Send in the card before your next appt

## 2012-05-10 NOTE — Progress Notes (Signed)
  Subjective:    Patient ID: Aaron Bishop, male    DOB: February 18, 1965, 47 y.o.   MRN: [id number]  HPI   47 y/o morbidly obese smoker, with hx of HTN with recent hospitalization from 11/12-17 for progressive SOB since 3 weeks and leg swelling . He was noted to be in acute hypercarbic respiratory failure with ABG of 7.28/66/78. This improved to 7.39/52/69 on discharge on 2 L of oxygen Patient continues to smoke 6-7 cigarettes daily for past several years.CXR was negative for pulm edema and showed some atelectasis. Pro BNP was low.  He was also treated as COPD flare with steroids, Oxygen supplementation, nebulizers and Azithromycin, with satisfactory clinical response. Bilateral leg edema was felt to be due to right heart failure, and responded satisfactorily to Lasix. 2D Echocardiogram revealed normal systolic dysfunction, EF 60-65%.  Sleep history:  He has been told he stops breathing at night, snores at night, feels tired during the day. he wears 2 liters of oxygen at night. Bedtime is 10 PM, sleep latency is 15 minutes, he sleeps on his side with 2 pillows. He is 4-5 awakenings without any post void sleep latency and is out of bed by 5 AM feeling tired with dryness of mouth and occasional headache. He gained 20 pounds prior to admission and has lost some due to diuresis.  Was placed on BiPAP during hospital stay and he had some problems exhaling but feels that he could adjust given the time. Epworth sleepiness score is 12/ 24.  He is worried about gaining weight after smoking cessation   46/27/2014 Severe OSA on study - AHI 180/h with nadir desatn 56% ! This was corrected by cpap 17 cm to AHI 18/h Order was sent to DME for autoCPAP 10-17 , med quattro mask, humidity,download in 4 wks  2L o2 blended in - but due tot he 'bad weather' he has not obtained his cpap yet Reports he has lost a few lbs    Review of Systems neg for any significant sore throat, dysphagia, itching, sneezing, nasal  congestion or excess/ purulent secretions, fever, chills, sweats, unintended wt loss, pleuritic or exertional cp, hempoptysis, orthopnea pnd or change in chronic leg swelling. Also denies presyncope, palpitations, heartburn, abdominal pain, nausea, vomiting, diarrhea or change in bowel or urinary habits, dysuria,hematuria, rash, arthralgias, visual complaints, headache, numbness weakness or ataxia.     Objective:   Physical Exam  Gen. Pleasant, obese, in no distress ENT - no lesions, no post nasal drip, class 3 airway Neck: No JVD, no thyromegaly, no carotid bruits Lungs: no use of accessory muscles, no dullness to percussion, decreased without rales or rhonchi  Cardiovascular: Rhythm regular, heart sounds  normal, no murmurs or gallops, no peripheral edema Musculoskeletal: No deformities, no cyanosis or clubbing , no tremors       Assessment & Plan:

## 2012-05-10 NOTE — Assessment & Plan Note (Addendum)
Severe OSA -AHi 180/h , corrected by cpap 10-17 cm OHS - note compensated  hypercarbia on ABG  Call DME to set up CPAP machine This will really help you You have severe obstructive sleep apnea  Stay on oxygen with CPAP for now Send in the card before your next appt  Weight loss encouraged, compliance with goal of at least 4-6 hrs every night is the expectation. Advised against medications with sedative side effects Cautioned against driving when sleepy - understanding that sleepiness will vary on a day to day basis

## 2012-05-14 NOTE — Assessment & Plan Note (Signed)
If unable to tolerate cpap will set up bipap Check ONO once tolerates CPAP in 3 mnths to evaluate for ongoing need for O2

## 2012-06-11 ENCOUNTER — Telehealth: Payer: Self-pay | Admitting: Pulmonary Disease

## 2012-06-11 NOTE — Telephone Encounter (Signed)
Fiance called back again re: same. Wants to pick up meds asap. Aaron Bishop

## 2012-06-11 NOTE — Telephone Encounter (Signed)
Pt's fiancee called back & wants this taken care of today for the pt.  She was very upset that no one has called her back yet.  Antionette Fairy

## 2012-06-11 NOTE — Telephone Encounter (Signed)
Order given to [email or web address]  because pt has 02 with [facility]

## 2012-06-11 NOTE — Telephone Encounter (Signed)
Spoke with pt's fiance She states that the pt needs refills on klor con 10 mg qd, lasix 40 mg bid, loratidine, and lisinopril.hct She states that the pt does not have a PCP at this time but plans to get one soon I advised will have to ask RA if okay to fill meds for now She also states [facility] has still not been in touch about CPAP Will forward msg to RA and [facility] box to address Please advise, thanks!

## 2012-06-11 NOTE — Telephone Encounter (Signed)
He was discharged in December & has not obtained PCP yet ! He needs to get PCP ASAP for refills on meds. OK to give lasix & KCl x 15 ds

## 2012-06-12 ENCOUNTER — Telehealth: Payer: Self-pay | Admitting: Pulmonary Disease

## 2012-06-12 DIAGNOSIS — G4733 Obstructive sleep apnea (adult) (pediatric): Secondary | ICD-10-CM

## 2012-06-12 MED ORDER — FUROSEMIDE 40 MG PO TABS: ORAL_TABLET | ORAL | Status: DC

## 2012-06-12 MED ORDER — POTASSIUM CHLORIDE ER 10 MEQ PO TBCR: 10.0000 meq | EXTENDED_RELEASE_TABLET | Freq: Every day | ORAL | Status: DC

## 2012-06-12 NOTE — Addendum Note (Signed)
Addended by: Tommie Sams on: 06/12/2012 09:51 AM   Modules accepted: Orders

## 2012-06-12 NOTE — Telephone Encounter (Signed)
i spoke with spouse and she is aware. RX's being sent. Also aware they need to get PCP ASAP. She voiced her understanding and needed nothing further

## 2012-06-12 NOTE — Telephone Encounter (Signed)
Order has been sent

## 2012-06-13 ENCOUNTER — Telehealth: Payer: Self-pay | Admitting: Pulmonary Disease

## 2012-06-13 DIAGNOSIS — J9600 Acute respiratory failure, unspecified whether with hypoxia or hypercapnia: Secondary | ICD-10-CM

## 2012-06-13 NOTE — Telephone Encounter (Signed)
Order sent to [facility] for o2 to be added to o2

## 2012-07-06 ENCOUNTER — Ambulatory Visit: Payer: Self-pay | Admitting: Pulmonary Disease

## 2012-07-12 ENCOUNTER — Inpatient Hospital Stay (HOSPITAL_COMMUNITY): Payer: Medicaid Other

## 2012-07-12 ENCOUNTER — Emergency Department (HOSPITAL_COMMUNITY): Payer: Medicaid Other

## 2012-07-12 ENCOUNTER — Inpatient Hospital Stay (HOSPITAL_COMMUNITY)
Admission: EM | Admit: 2012-07-12 | Discharge: 2012-07-20 | DRG: 189 | Disposition: A | Discharge status: Home Health | Payer: Medicaid Other | Attending: Pulmonary Disease | Admitting: Pulmonary Disease

## 2012-07-12 ENCOUNTER — Encounter (HOSPITAL_COMMUNITY): Payer: Self-pay | Admitting: Emergency Medicine

## 2012-07-12 DIAGNOSIS — I2609 Other pulmonary embolism with acute cor pulmonale: Secondary | ICD-10-CM

## 2012-07-12 DIAGNOSIS — I10 Essential (primary) hypertension: Secondary | ICD-10-CM | POA: Diagnosis present

## 2012-07-12 DIAGNOSIS — J9620 Acute and chronic respiratory failure, unspecified whether with hypoxia or hypercapnia: Principal | ICD-10-CM

## 2012-07-12 DIAGNOSIS — R609 Edema, unspecified: Secondary | ICD-10-CM | POA: Diagnosis present

## 2012-07-12 DIAGNOSIS — R0902 Hypoxemia: Secondary | ICD-10-CM | POA: Diagnosis present

## 2012-07-12 DIAGNOSIS — R739 Hyperglycemia, unspecified: Secondary | ICD-10-CM | POA: Diagnosis present

## 2012-07-12 DIAGNOSIS — J9600 Acute respiratory failure, unspecified whether with hypoxia or hypercapnia: Secondary | ICD-10-CM

## 2012-07-12 DIAGNOSIS — G9341 Metabolic encephalopathy: Secondary | ICD-10-CM | POA: Diagnosis present

## 2012-07-12 DIAGNOSIS — I509 Heart failure, unspecified: Secondary | ICD-10-CM | POA: Diagnosis present

## 2012-07-12 DIAGNOSIS — J810 Acute pulmonary edema: Secondary | ICD-10-CM | POA: Diagnosis present

## 2012-07-12 DIAGNOSIS — J9691 Respiratory failure, unspecified with hypoxia: Secondary | ICD-10-CM

## 2012-07-12 DIAGNOSIS — Z6841 Body Mass Index (BMI) 40.0 and over, adult: Secondary | ICD-10-CM

## 2012-07-12 DIAGNOSIS — E873 Alkalosis: Secondary | ICD-10-CM

## 2012-07-12 DIAGNOSIS — R7309 Other abnormal glucose: Secondary | ICD-10-CM

## 2012-07-12 DIAGNOSIS — I279 Pulmonary heart disease, unspecified: Secondary | ICD-10-CM | POA: Diagnosis present

## 2012-07-12 DIAGNOSIS — F17200 Nicotine dependence, unspecified, uncomplicated: Secondary | ICD-10-CM | POA: Diagnosis present

## 2012-07-12 DIAGNOSIS — I5033 Acute on chronic diastolic (congestive) heart failure: Secondary | ICD-10-CM | POA: Diagnosis present

## 2012-07-12 DIAGNOSIS — J9692 Respiratory failure, unspecified with hypercapnia: Secondary | ICD-10-CM

## 2012-07-12 DIAGNOSIS — R600 Localized edema: Secondary | ICD-10-CM

## 2012-07-12 DIAGNOSIS — E662 Morbid (severe) obesity with alveolar hypoventilation: Secondary | ICD-10-CM

## 2012-07-12 DIAGNOSIS — G4733 Obstructive sleep apnea (adult) (pediatric): Secondary | ICD-10-CM | POA: Diagnosis present

## 2012-07-12 LAB — BASIC METABOLIC PANEL
CO2: 33 mEq/L — ABNORMAL HIGH (ref 19–32)
Chloride: 94 mEq/L — ABNORMAL LOW (ref 96–112)
Creatinine, Ser: 1.32 mg/dL (ref 0.50–1.35)
GFR calc Af Amer: 74 mL/min — ABNORMAL LOW (ref >90)
Potassium: 5.1 mEq/L (ref 3.5–5.1)

## 2012-07-12 LAB — BLOOD GAS, ARTERIAL
Acid-Base Excess: 3.1 mmol/L — ABNORMAL HIGH (ref 0.0–2.0)
Acid-Base Excess: 3.6 mmol/L — ABNORMAL HIGH (ref 0.0–2.0)
Bicarbonate: 33.5 mEq/L — ABNORMAL HIGH (ref 20.0–24.0)
Drawn by: 257701
FIO2: 1 %
O2 Saturation: 97.8 %
TCO2: 32 mmol/L (ref 0–100)
TCO2: 30.6 mmol/L (ref 0–100)
pCO2 arterial: 91.8 mmHg (ref 35.0–45.0)
pCO2 arterial: 79.4 mmHg (ref 35.0–45.0)
pH, Arterial: 7.249 — ABNORMAL LOW (ref 7.350–7.450)
pO2, Arterial: 136 mmHg — ABNORMAL HIGH (ref 80.0–100.0)

## 2012-07-12 LAB — HEMOGLOBIN A1C
Hgb A1c MFr Bld: 7.1 % — ABNORMAL HIGH (ref <5.7)
Mean Plasma Glucose: 157 mg/dL — ABNORMAL HIGH (ref <117)

## 2012-07-12 LAB — MRSA PCR SCREENING: MRSA by PCR: POSITIVE — AB

## 2012-07-12 LAB — CBC
HCT: 43.3 % (ref 39.0–52.0)
MCH: 29.6 pg (ref 26.0–34.0)
MCHC: 30.9 g/dL (ref 30.0–36.0)
RDW: 17.3 % — ABNORMAL HIGH (ref 11.5–15.5)

## 2012-07-12 LAB — POCT I-STAT TROPONIN I: Troponin i, poc: 0.03 ng/mL (ref 0.00–0.08)

## 2012-07-12 LAB — MAGNESIUM: Magnesium: 2.3 mg/dL (ref 1.5–2.5)

## 2012-07-12 LAB — TSH: TSH: 0.812 u[IU]/mL (ref 0.350–4.500)

## 2012-07-12 LAB — PRO B NATRIURETIC PEPTIDE: Pro B Natriuretic peptide (BNP): 1941 pg/mL — ABNORMAL HIGH (ref 0–125)

## 2012-07-12 MED ORDER — ALBUTEROL SULFATE HFA 108 (90 BASE) MCG/ACT IN AERS: 4.0000 | INHALATION_SPRAY | RESPIRATORY_TRACT | Status: DC | PRN

## 2012-07-12 MED ORDER — FUROSEMIDE 10 MG/ML IJ SOLN
80.0000 mg | Freq: Once | INTRAMUSCULAR | Status: AC
  Administered 2012-07-12: 80 mg via INTRAVENOUS
  Filled 2012-07-12: qty 8

## 2012-07-12 MED ORDER — FUROSEMIDE 10 MG/ML IJ SOLN: 40.0000 mg | Freq: Two times a day (BID) | INTRAMUSCULAR | Status: DC

## 2012-07-12 MED ORDER — FAMOTIDINE IN NACL 20-0.9 MG/50ML-% IV SOLN
20.0000 mg | Freq: Two times a day (BID) | INTRAVENOUS | Status: DC
  Administered 2012-07-12 – 2012-07-14 (×5): 20 mg via INTRAVENOUS
  Filled 2012-07-12 (×7): qty 50

## 2012-07-12 MED ORDER — HEPARIN SODIUM (PORCINE) 5000 UNIT/ML IJ SOLN
5000.0000 [IU] | Freq: Three times a day (TID) | INTRAMUSCULAR | Status: DC
  Administered 2012-07-12 – 2012-07-20 (×23): 5000 [IU] via SUBCUTANEOUS
  Filled 2012-07-12 (×28): qty 1

## 2012-07-12 MED ORDER — FUROSEMIDE 10 MG/ML IJ SOLN
40.0000 mg | Freq: Two times a day (BID) | INTRAMUSCULAR | Status: AC
  Administered 2012-07-12 – 2012-07-13 (×3): 40 mg via INTRAVENOUS
  Filled 2012-07-12 (×3): qty 4

## 2012-07-12 NOTE — ED Provider Notes (Signed)
History    CSN: [id number] Arrival date & time 07/12/12  1347 First MD Initiated Contact with Patient 07/12/12 1358      Chief Complaint  Patient presents with  . Shortness of Breath    HPI Patient presents to emergency room with complaints of shortness of breath. Patient has a history of congestive heart failure, morbid obesity and asthma. Patient states over the last 2 weeks he has been getting gradually more short of breath. He does use home oxygen and has been using it constantly. Patient feels like his breathing is getting worse when he lies down as well as whenever he tries to exert himself at all. Significant swelling. He feels like he has been retaining fluid. Past Medical History  Diagnosis Date  . Hypertension   . Morbid obesity   . Asthma   . Hyperlipidemia   . Respiratory failure     Past Surgical History  Procedure Laterality Date  . No past surgeries      Family History  Problem Relation Age of Onset  . Asthma Mother   . Allergies Mother   . Cancer Maternal Grandmother     History  Substance Use Topics  . Smoking status: Current Every Day Smoker -- 0.20 packs/day for 30 years    Types: Cigarettes  . Smokeless tobacco: Never Used  . Alcohol Use: Yes     Comment: 1 bottle per week      Review of Systems  All other systems reviewed and are negative.    Allergies  Review of patient's allergies indicates no known allergies.  Home Medications   Current Outpatient Rx  Name  Route  Sig  Dispense  Refill  . albuterol (PROVENTIL) (5 MG/ML) 0.5% nebulizer solution   Nebulization   Take 0.5 mLs (2.5 mg total) by nebulization every 4 (four) hours as needed for wheezing.   20 mL   2   . furosemide (LASIX) 40 MG tablet      TAKE ONE TABLET BY MOUTH TWICE DAILY   15 tablet   0   . loratadine (CLARITIN) 10 MG tablet   Oral   Take 10 mg by mouth daily.         . potassium chloride (K-DUR) 10 MEQ tablet   Oral   Take 1 tablet (10 mEq total) by  mouth daily.   15 tablet   0     BP 125/71  Pulse 99  Temp(Src) 98.3 F (36.8 C) (Oral)  Resp 22  SpO2 97%  Physical Exam  Nursing note and vitals reviewed. Constitutional: No distress.  Morbidly obese  HENT:  Head: Normocephalic and atraumatic.  Right Ear: External ear normal.  Left Ear: External ear normal.  Eyes: Conjunctivae are normal. Right eye exhibits no discharge. Left eye exhibits no discharge. No scleral icterus.  Neck: Neck supple. No tracheal deviation present.  Cardiovascular: Normal rate, regular rhythm and intact distal pulses.   Pulmonary/Chest: Effort normal. No stridor. No respiratory distress. He has no wheezes. He has no rales.  Distant breath sounds bilaterally  Abdominal: Soft. Bowel sounds are normal. He exhibits no distension. There is no tenderness. There is no rebound and no guarding.  Protuberant abdomen, large edematous pannus  Musculoskeletal: He exhibits edema. He exhibits no tenderness.  Large pitting edema bilateral lower extremities  Neurological: He is alert. He has normal strength. No sensory deficit. Cranial nerve deficit:  no gross defecits noted. He exhibits normal muscle tone. He displays no seizure activity. Coordination  normal.  Skin: Skin is warm and dry. No rash noted. He is not diaphoretic.  Psychiatric: He has a normal mood and affect.    ED Course  Procedures (including critical care time) EKG Normal sinus rhythm rate 96 Normal axis, normal intervals, nonspecific ST-T waves No significant change except for nonspecific ST changes when. Prior EKG  3:21 PM patient's blood gas shows severe hypercapnia with respiratory acidosis. Slightly related to his morbid obesity, pickwickian syndrome, and a component of congestive heart failure.  Patient is somnolent but is easily arousable still. I will start the patient on BiPAP he'll be admitted to the intensive care unit. We will monitor closely. He may require intubation if he does not  respond to treatment.  CRITICAL CARE Performed by: Celene Kras Total critical care time: 35 Critical care time was exclusive of separately billable procedures and treating other patients. Critical care was necessary to treat or prevent imminent or life-threatening deterioration. Critical care was time spent personally by me on the following activities: development of treatment plan with patient and/or surrogate as well as nursing, discussions with consultants, evaluation of patient's response to treatment, examination of patient, obtaining history from patient or surrogate, ordering and performing treatments and interventions, ordering and review of laboratory studies, ordering and review of radiographic studies, pulse oximetry and re-evaluation of patient's condition.   Labs Reviewed  CBC - Abnormal; Notable for the following:    RDW 17.3 (*)    All other components within normal limits  PRO B NATRIURETIC PEPTIDE - Abnormal; Notable for the following:    Pro B Natriuretic peptide (BNP) 1941.0 (*)    All other components within normal limits  BLOOD GAS, ARTERIAL - Abnormal; Notable for the following:    pH, Arterial 7.199 (*)    pCO2 arterial 91.8 (*)    pO2, Arterial 136.0 (*)    Bicarbonate 34.4 (*)    Acid-Base Excess 3.1 (*)    All other components within normal limits  BASIC METABOLIC PANEL  BASIC METABOLIC PANEL  POCT I-STAT TROPONIN I   No results found.   1. Respiratory failure with hypercapnia   2. OSA (obstructive sleep apnea)   3. Morbid obesity   4. Peripheral edema       MDM  Patient presents with recurrent hypercapnic respiratory failure. His chest x-ray is pending but I suspect he also will have a component of congestive heart failure. Patient unfortunately continues to smoke and he is morbidly obese greater than 400 pounds. Will continue with BiPAP. Patient may require intubation if he does not respond although this would be a very difficult situation regarding  intubation itself and eventual extubation.        Celene Kras, MD 07/12/12 [phone]

## 2012-07-12 NOTE — Progress Notes (Signed)
Pt more alert and is saying the pressure is too much to tolerate, BIPAP settings changed to 16/6 and 35%. Pt tolerating well at this time, RT to monitor and assess as needed.

## 2012-07-12 NOTE — Progress Notes (Signed)
[facility] ED CM noted CM consult--Pt has trouble picking up and affording medications. His mother helps him out with this. His mother has also been trying to get him approved for disability but he has been denied. Please provide any available resources.   ED CM unable to assess pt related continuous evaluation of ED staff, respiratory staff and consulting providers  [facility] community Liaison attempted to see pt without success also

## 2012-07-12 NOTE — H&P (Signed)
PULMONARY  / CRITICAL CARE MEDICINE  Name: Aaron Bishop MRN: [id number] DOB: 1965-02-27    ADMISSION DATE:  07/12/2012 CONSULTATION DATE:  07/11/12  REFERRING MD :  EDP PRIMARY SERVICE: CCM  CHIEF COMPLAINT:  Hypercarbia  BRIEF PATIENT DESCRIPTION: 47 year old male smoker, with morbid obesity and hypertension, admitted 5/29 with fluid volume overload and hypercarbic respiratory failure secondary to decompensated OSA/OHS and acute on chronic cor pulmonale.  SIGNIFICANT EVENTS: 5/29 Admit ICU   STUDIES:   LINES / TUBES: 5/29 PIV>>> 5/29 Foley>>>  CULTURES: None  ANTIBIOTICS: None  HISTORY OF PRESENT ILLNESS:  Aaron Bishop is a 47 year old male, smoker, with morbid obesity and OSA. Pt has noted over the past 3 weeks increased edema in his legs and abdomen. He also has to sleep in a recliner but his sleep has been decreased over the past few days due to SOB.   His mother noted that he was quite sleepy today when she went to his house, and he has become more somnolent over the course of the day.  He was taken to his PCP due to the somnolence and was referred to the ED. His ABG noted hypercarbia with a respiratory acidosis.  PCCM consulted for the management of his hypercarbic respiratory failure.  PAST MEDICAL HISTORY :  Past Medical History  Diagnosis Date  . Hypertension   . Morbid obesity   . Asthma   . Hyperlipidemia   . Respiratory failure    Past Surgical History  Procedure Laterality Date  . No past surgeries     Prior to Admission medications   Medication Sig Start Date End Date Taking? Authorizing Provider  albuterol (PROVENTIL) (5 MG/ML) 0.5% nebulizer solution Take 0.5 mLs (2.5 mg total) by nebulization every 4 (four) hours as needed for wheezing. 01/01/12  Yes Laveda Norman, MD  furosemide (LASIX) 40 MG tablet TAKE ONE TABLET BY MOUTH TWICE DAILY 06/12/12  Yes Michele Mcalpine, MD  loratadine (CLARITIN) 10 MG tablet Take 10 mg by mouth daily.   Yes Historical  Provider, MD  potassium chloride (K-DUR) 10 MEQ tablet Take 1 tablet (10 mEq total) by mouth daily. 06/12/12  Yes Michele Mcalpine, MD   No Known Allergies  FAMILY HISTORY:  Family History  Problem Relation Age of Onset  . Asthma Mother   . Allergies Mother   . Cancer Maternal Grandmother    SOCIAL HISTORY:  reports that he has been smoking Cigarettes.  He has a 6 pack-year smoking history. He has never used smokeless tobacco. He reports that  drinks alcohol. He reports that he does not use illicit drugs.  REVIEW OF SYSTEMS:  Unable to obtain due to mental status  SUBJECTIVE:   Somnolent, difficult to arouse  VITAL SIGNS: Temp:  [98.3 F (36.8 C)] 98.3 F (36.8 C) (05/29 1355) Pulse Rate:  [91-99] 92 (05/29 1645) Resp:  [22-29] 29 (05/29 1645) BP: (125-146)/(64-71) 146/66 mmHg (05/29 1645) SpO2:  [95 %-100 %] 95 % (05/29 1645)  HEMODYNAMICS:    VENTILATOR SETTINGS:    INTAKE / OUTPUT: Intake/Output     05/28 0701 - 05/29 0700 05/29 0701 - 05/30 0700   Urine  300   Total Output   300   Net   -300          PHYSICAL EXAMINATION: General:  Somnolent obese male on stretcher Neuro:  Lethargic but arouses, holds up two fingers on command HEENT:  PERRL Cardiovascular:  RRR, S1 and  S2. No JVD, +2 radial and DP pulses Lungs:  Diminished breath sounds bilateral anterior. No wheeze or rhonchi Abdomen:  Soft, round, non-tender, BS present Musculoskeletal:  Muscle strength 4/5 in all extremities, MAE well and equal Skin:  +4 pitting edema to lower extremities, skin is intact  LABS:  Recent Labs Lab 07/12/12 1414 07/12/12 1521  NA SPECIMEN HEMOLYZED. HEMOLYSIS MAY AFFECT INTEGRITY OF RESULTS. 132*  K SPECIMEN HEMOLYZED. HEMOLYSIS MAY AFFECT INTEGRITY OF RESULTS. 5.1  CL SPECIMEN HEMOLYZED. HEMOLYSIS MAY AFFECT INTEGRITY OF RESULTS. 94*  CO2 SPECIMEN HEMOLYZED. HEMOLYSIS MAY AFFECT INTEGRITY OF RESULTS. 33*  BUN SPECIMEN HEMOLYZED. HEMOLYSIS MAY AFFECT INTEGRITY OF  RESULTS. 15  CREATININE SPECIMEN HEMOLYZED. HEMOLYSIS MAY AFFECT INTEGRITY OF RESULTS. 1.32  GLUCOSE SPECIMEN HEMOLYZED. HEMOLYSIS MAY AFFECT INTEGRITY OF RESULTS. 122*    Recent Labs Lab 07/12/12 1414  HGB 13.4  HCT 43.3  WBC 5.8  PLT 205   ABG    Component Value Date/Time   PHART 7.249* 07/12/2012 1601   PCO2ART 79.4* 07/12/2012 1601   PO2ART 59.4* 07/12/2012 1601   HCO3 33.5* 07/12/2012 1601   TCO2 30.6 07/12/2012 1601   O2SAT 85.0 07/12/2012 1601     Imaging: Dg Chest Portable 1 View  07/12/2012   *RADIOLOGY REPORT*  Clinical Data: Shortness of breath.  Respiratory distress.  Morbid obesity.  PORTABLE CHEST - 1 VIEW  Comparison: Chest x-ray on 12/27/2011.  Findings: Lung volumes are low.  Film is under penetrated, which limits the diagnostic sensitivity and specificity of the examination.  With this limitation in mind, there is cephalization of the pulmonary vasculature, indistinctness of the interstitial markings, and patchy airspace disease throughout the lungs bilaterally suggestive of moderate pulmonary edema.  Small bilateral pleural effusions.  Mild cardiomegaly. The patient is rotated to the left on today's exam, resulting in distortion of the mediastinal contours and reduced diagnostic sensitivity and specificity for mediastinal pathology.  IMPRESSION: 1.  Limited examination which appears to demonstrate evidence of congestive heart failure, as above.   Original Report Authenticated By: Trudie Reed, M.D.     ASSESSMENT / PLAN:  PULMONARY A: Hypercarbic respiratory failure secondary to morbid obesity and OSA P:   -Improved with Bipap >> defer intubation for now  -f/u ABG as needed -f/u chest x-ray  CARDIOVASCULAR A: Acute on chronic cor pulmonale.     Hypertension. P:  -Diuresis with IV lasix -Maintain SBP <180 -f/u bnp and echo results  RENAL A:  Fluid overload P:   -Diuresis as above  GASTROINTESTINAL A:  Morbid obesity P:   -Maintain NPO status  currently -Can resume diet when awake and able to protect airway -will need nutrition consult to assist with weight loss efforts -pepcid for SUP  HEMATOLOGIC A:  No acute issues P:  -Follow cbc as needed  INFECTIOUS A:  No active issues P:   -monitor clinically  ENDOCRINE A: Mild hyperglycemia >> no prior hx of DM. P:   -cbg q6h while NPO -check HbA1c -check TSH  NEUROLOGIC A:  Acute metabolic encephalopathy in the setting of hypercarbic respiratory failure; improving with Bipap. P:   -Bipap as above -minimize use of sedative agents  TODAY'S SUMMARY: Mr. Mall presents with acute hypercarbic respiratory failure and acute metabolic encephalopathy due to morbid obesity and OSA/OHS. We will continue Bipap and aggressive IV diuresis.  Updated pt's mother at bedside about plan.  I have personally obtained a history, examined the patient, evaluated laboratory and imaging results, formulated the assessment and plan  and placed orders.  CRITICAL CARE: The patient is critically ill with multiple organ systems failure and requires high complexity decision making for assessment and support, frequent evaluation and titration of therapies, application of advanced monitoring technologies and extensive interpretation of multiple databases. Critical Care Time devoted to patient care services described in this note is 50 minutes.   Coralyn Helling, MD [facility] Pulmonary/Critical Care 07/12/2012, 5:05 PM Pager:  [phone] After 3pm call: [phone]     07/12/2012, 5:01 PM

## 2012-07-12 NOTE — ED Notes (Signed)
Per pt/family has been retaining fluid, SOB for 2 weeks-went to PCP and had labs drawn-on 3L of O2 at home-did not arrive at ED with portable O2-sats in 60's-placed on 7L [place] @95 %-B/L lower extremity edema,  SOB upon exertion

## 2012-07-13 ENCOUNTER — Inpatient Hospital Stay (HOSPITAL_COMMUNITY): Payer: Medicaid Other

## 2012-07-13 DIAGNOSIS — I517 Cardiomegaly: Secondary | ICD-10-CM

## 2012-07-13 LAB — CBC
MCV: 99.8 fL (ref 78.0–100.0)
Platelets: 169 10*3/uL (ref 150–400)
RBC: 4.41 MIL/uL (ref 4.22–5.81)
RDW: 17.2 % — ABNORMAL HIGH (ref 11.5–15.5)
WBC: 5.8 10*3/uL (ref 4.0–10.5)

## 2012-07-13 LAB — BLOOD GAS, ARTERIAL
Acid-Base Excess: 6.2 mmol/L — ABNORMAL HIGH (ref 0.0–2.0)
Drawn by: 308601
Inspiratory PAP: 16
Mode: POSITIVE
pCO2 arterial: 79 mmHg (ref 35.0–45.0)
pH, Arterial: 7.276 — ABNORMAL LOW (ref 7.350–7.450)
pO2, Arterial: 60.2 mmHg — ABNORMAL LOW (ref 80.0–100.0)

## 2012-07-13 LAB — BASIC METABOLIC PANEL
Calcium: 9.1 mg/dL (ref 8.4–10.5)
GFR calc Af Amer: 69 mL/min — ABNORMAL LOW (ref >90)
GFR calc non Af Amer: 60 mL/min — ABNORMAL LOW (ref >90)
Glucose, Bld: 109 mg/dL — ABNORMAL HIGH (ref 70–99)
Potassium: 5.2 mEq/L — ABNORMAL HIGH (ref 3.5–5.1)
Sodium: 135 mEq/L (ref 135–145)

## 2012-07-13 LAB — GLUCOSE, CAPILLARY
Glucose-Capillary: 123 mg/dL — ABNORMAL HIGH (ref 70–99)
Glucose-Capillary: 101 mg/dL — ABNORMAL HIGH (ref 70–99)
Glucose-Capillary: 140 mg/dL — ABNORMAL HIGH (ref 70–99)

## 2012-07-13 LAB — MAGNESIUM: Magnesium: 2.2 mg/dL (ref 1.5–2.5)

## 2012-07-13 LAB — PRO B NATRIURETIC PEPTIDE: Pro B Natriuretic peptide (BNP): 1113 pg/mL — ABNORMAL HIGH (ref 0–125)

## 2012-07-13 NOTE — Progress Notes (Signed)
Notified Elink MD of pt blood gas. MD aware. Will continue to monitor.  Langley Gauss, RN

## 2012-07-13 NOTE — Progress Notes (Signed)
PULMONARY  / CRITICAL CARE MEDICINE  Name: Aaron Bishop MRN: [id number] DOB: Jan 03, 1966    ADMISSION DATE:  07/12/2012 CONSULTATION DATE:  07/11/12  REFERRING MD :  EDP PRIMARY SERVICE: CCM  CHIEF COMPLAINT:  Hypercarbia  BRIEF PATIENT DESCRIPTION: 47 year old male smoker, with morbid obesity and hypertension, admitted 5/29 with fluid volume overload and hypercarbic respiratory failure secondary to decompensated OSA/OHS and acute on chronic cor pulmonale.  SIGNIFICANT EVENTS: 5/29 Admit ICU   STUDIES:   LINES / TUBES: 5/29 PIV>>> 5/29 Foley>>>  CULTURES: None  ANTIBIOTICS: None  HISTORY OF PRESENT ILLNESS:  Aaron Bishop is a 47 year old male, smoker, with morbid obesity and OSA. Pt has noted over the past 3 weeks increased edema in his legs and abdomen. He also has to sleep in a recliner but his sleep has been decreased over the past few days due to SOB.   His mother noted that he was quite sleepy today when she went to his house, and he has become more somnolent over the course of the day.  He was taken to his PCP due to the somnolence and was referred to the ED. His ABG noted hypercarbia with a respiratory acidosis.  PCCM consulted for the management of his hypercarbic respiratory failure.  SUBJECTIVE:   Somnolent, difficult to arouse  VITAL SIGNS: Temp:  [98.3 F (36.8 C)-99.3 F (37.4 C)] 99.3 F (37.4 C) (05/30 0400) Pulse Rate:  [68-99] 82 (05/30 0833) Resp:  [20-33] 29 (05/30 0833) BP: (106-147)/(47-117) 147/62 mmHg (05/30 0800) SpO2:  [87 %-100 %] 94 % (05/30 0833) FiO2 (%):  [35 %] 35 % (05/30 0800) Weight:  [224.5 kg (494 lb 14.9 oz)-227.4 kg (501 lb 5.2 oz)] 224.5 kg (494 lb 14.9 oz) (05/30 0600)  HEMODYNAMICS:    VENTILATOR SETTINGS: Vent Mode:  [-]  FiO2 (%):  [35 %] 35 %  INTAKE / OUTPUT: Intake/Output     05/29 0701 - 05/30 0700 05/30 0701 - 05/31 0700   Other 80 10   IV Piggyback 50    Total Intake(mL/kg) 130 (0.6) 10 (0)   Urine  (mL/kg/hr) 4600 135 (0.4)   Total Output 4600 135   Net -4470 -125          PHYSICAL EXAMINATION: General:  Somnolent obese male on stretcher Neuro:  Lethargic but arouses, holds up two fingers on command HEENT:  PERRL Cardiovascular:  RRR, S1 and S2. No JVD, +2 radial and DP pulses Lungs:  Diminished breath sounds bilateral anterior. No wheeze or rhonchi Abdomen:  Soft, round, non-tender, BS present Musculoskeletal:  Muscle strength 4/5 in all extremities, MAE well and equal Skin:  +4 pitting edema to lower extremities, skin is intact  LABS:  Recent Labs Lab 07/12/12 1414 07/12/12 1521 07/13/12 0528  NA SPECIMEN HEMOLYZED. HEMOLYSIS MAY AFFECT INTEGRITY OF RESULTS. 132* 135  K SPECIMEN HEMOLYZED. HEMOLYSIS MAY AFFECT INTEGRITY OF RESULTS. 5.1 5.2*  CL SPECIMEN HEMOLYZED. HEMOLYSIS MAY AFFECT INTEGRITY OF RESULTS. 94* 94*  CO2 SPECIMEN HEMOLYZED. HEMOLYSIS MAY AFFECT INTEGRITY OF RESULTS. 33* 37*  BUN SPECIMEN HEMOLYZED. HEMOLYSIS MAY AFFECT INTEGRITY OF RESULTS. 15 15  CREATININE SPECIMEN HEMOLYZED. HEMOLYSIS MAY AFFECT INTEGRITY OF RESULTS. 1.32 1.39*  GLUCOSE SPECIMEN HEMOLYZED. HEMOLYSIS MAY AFFECT INTEGRITY OF RESULTS. 122* 109*    Recent Labs Lab 07/12/12 1414 07/13/12 0528  HGB 13.4 13.0  HCT 43.3 44.0  WBC 5.8 5.8  PLT 205 169   ABG    Component Value Date/Time   PHART  7.276* 07/13/2012 0425   PCO2ART 79.0* 07/13/2012 0425   PO2ART 60.2* 07/13/2012 0425   HCO3 35.5* 07/13/2012 0425   TCO2 32.6 07/13/2012 0425   O2SAT 88.3 07/13/2012 0425     Imaging: Dg Chest Port 1 View  07/13/2012   *RADIOLOGY REPORT*  Clinical Data: Pulmonary edema.  Airspace disease.  PORTABLE CHEST - 1 VIEW  Comparison: Single view of the chest 07/12/2012.  Findings: There is cardiomegaly.  Extensive bilateral airspace disease likely due to congestive heart failure is unchanged.  No pneumothorax identified.  IMPRESSION: No change in extensive bilateral airspace disease likely due to  congestive heart failure.   Original Report Authenticated By: Holley Dexter, M.D.   Dg Chest Portable 1 View  07/12/2012   *RADIOLOGY REPORT*  Clinical Data: Shortness of breath.  Respiratory distress.  Morbid obesity.  PORTABLE CHEST - 1 VIEW  Comparison: Chest x-ray on 12/27/2011.  Findings: Lung volumes are low.  Film is under penetrated, which limits the diagnostic sensitivity and specificity of the examination.  With this limitation in mind, there is cephalization of the pulmonary vasculature, indistinctness of the interstitial markings, and patchy airspace disease throughout the lungs bilaterally suggestive of moderate pulmonary edema.  Small bilateral pleural effusions.  Mild cardiomegaly. The patient is rotated to the left on today's exam, resulting in distortion of the mediastinal contours and reduced diagnostic sensitivity and specificity for mediastinal pathology.  IMPRESSION: 1.  Limited examination which appears to demonstrate evidence of congestive heart failure, as above.   Original Report Authenticated By: Trudie Reed, M.D.     ASSESSMENT / PLAN:  PULMONARY A: Hypercarbic respiratory failure secondary to morbid obesity and OSA P:   -Improved with Bipap >> defer intubation for now  -f/u ABG as needed -f/u chest x-ray  CARDIOVASCULAR A: Acute on chronic cor pulmonale.     Hypertension. P:  -Diuresis with IV lasix -Maintain SBP <180 -f/u bnp and echo results  RENAL A:  Fluid overload P:   -Diuresis as above  GASTROINTESTINAL A:  Morbid obesity P:   -Maintain NPO status currently -Can resume diet when awake and able to protect airway -will need nutrition consult to assist with weight loss efforts -pepcid for SUP  HEMATOLOGIC A:  No acute issues P:  -Follow cbc as needed  INFECTIOUS A:  No active issues P:   -monitor clinically  ENDOCRINE A: Mild hyperglycemia >> no prior hx of DM (A1c 7.1 on admission) P:   -cbg q6h while NPO -TSH not elevated on  admission   NEUROLOGIC A:  Acute metabolic encephalopathy in the setting of hypercarbic respiratory failure; improving with Bipap. P:   -Bipap as above -minimize use of sedative agents  TODAY'S SUMMARY: [name] presents with acute hypercarbic respiratory failure and acute metabolic encephalopathy due to morbid obesity and OSA/OHS. We will continue Bipap and aggressive IV diuresis.  Note by Nyra Jabs NP student and  Anders Simmonds NP     07/13/2012, 8:39 AM    STAFF NOTE: I, Dr Lavinia Sharps have personally reviewed patient's available data, including medical history, events of note, physical examination and test results as part of my evaluation. I have discussed with resident/NP and other care providers such as pharmacist, RN and RRT.  In addition,  I personally evaluated patient and elicited key findings of acute hypercarbic respiratory  failure and decompensated acute on chronic diastolic heart failure and cor pulmonale. Slowly improving, still needs continous bipap.  Rest per NP/medical resident whose note is outlined  above and that I agree with  The patient is critically ill with multiple organ systems failure and requires high complexity decision making for assessment and support, frequent evaluation and titration of therapies, application of advanced monitoring technologies and extensive interpretation of multiple databases.   Critical Care Time devoted to patient care services described in this note is  35  Minutes indepddent of NP time.  Dr. Kalman Shan, M.D., [facility].C.P Pulmonary and Critical Care Medicine Staff Physician [place] [facility] [place] [facility] Pager: [phone], If no answer or between  15:00h - 7:00h: call [phone]  07/13/2012 10:35 AM

## 2012-07-13 NOTE — Progress Notes (Signed)
[id number]/Rhonda Davis, RN, BSN, CCM:  CHART REVIEWED AND UPDATED.  Next chart review due on [id number]. NO DISCHARGE NEEDS PRESENT AT THIS TIME. CASE MANAGEMENT [phone] 

## 2012-07-13 NOTE — Progress Notes (Signed)
*  PRELIMINARY RESULTS* Echocardiogram 2D Echocardiogram has been performed.  Aaron Bishop 07/13/2012, 10:40 AM

## 2012-07-14 LAB — GLUCOSE, CAPILLARY
Glucose-Capillary: 110 mg/dL — ABNORMAL HIGH (ref 70–99)
Glucose-Capillary: 76 mg/dL (ref 70–99)
Glucose-Capillary: 113 mg/dL — ABNORMAL HIGH (ref 70–99)

## 2012-07-14 MED ORDER — INSULIN ASPART 100 UNIT/ML ~~LOC~~ SOLN
0.0000 [IU] | Freq: Three times a day (TID) | SUBCUTANEOUS | Status: DC
  Administered 2012-07-14: 2 [IU] via SUBCUTANEOUS

## 2012-07-14 MED ORDER — INSULIN ASPART 100 UNIT/ML ~~LOC~~ SOLN: 0.0000 [IU] | Freq: Every day | SUBCUTANEOUS | Status: DC

## 2012-07-14 MED ORDER — ACETAMINOPHEN 325 MG PO TABS
650.0000 mg | ORAL_TABLET | Freq: Four times a day (QID) | ORAL | Status: DC | PRN
  Administered 2012-07-14 – 2012-07-20 (×10): 650 mg via ORAL
  Filled 2012-07-14 (×2): qty 2
  Filled 2012-07-14: qty 1
  Filled 2012-07-14 (×2): qty 2
  Filled 2012-07-14 (×2): qty 1
  Filled 2012-07-14 (×4): qty 2

## 2012-07-14 MED ORDER — SPIRONOLACTONE 50 MG PO TABS
50.0000 mg | ORAL_TABLET | Freq: Two times a day (BID) | ORAL | Status: DC
  Administered 2012-07-14 – 2012-07-20 (×13): 50 mg via ORAL
  Filled 2012-07-14 (×17): qty 1

## 2012-07-14 MED ORDER — INSULIN ASPART 100 UNIT/ML ~~LOC~~ SOLN
6.0000 [IU] | Freq: Three times a day (TID) | SUBCUTANEOUS | Status: DC
  Administered 2012-07-14 – 2012-07-15 (×3): 6 [IU] via SUBCUTANEOUS

## 2012-07-14 MED ORDER — FUROSEMIDE 10 MG/ML IJ SOLN
40.0000 mg | Freq: Four times a day (QID) | INTRAMUSCULAR | Status: AC
Start: 2012-07-14 — End: 2012-07-14
  Administered 2012-07-14 (×3): 40 mg via INTRAVENOUS
  Filled 2012-07-14 (×3): qty 4

## 2012-07-14 NOTE — Progress Notes (Signed)
Pt keeps ripping BIPAP mask off and is refusing for it to go back on at this time, RT explained the importance of him wearing the mask and he still refuses.  Pt seems confused, Pt placed back on 6 LPM O2.  RT to monitor and assess as needed.

## 2012-07-14 NOTE — Progress Notes (Signed)
PULMONARY  / CRITICAL CARE MEDICINE  Name: Aaron Bishop MRN: [id number] DOB: [birth year]    ADMISSION DATE:  07/12/2012 CONSULTATION DATE:  07/11/12  REFERRING MD :  EDP PRIMARY SERVICE: CCM  CHIEF COMPLAINT:  Hypercarbia  BRIEF PATIENT DESCRIPTION: 47 year old male smoker, with morbid obesity and hypertension, admitted 5/29 with fluid volume overload and hypercarbic respiratory failure secondary to decompensated OSA/OHS and acute on chronic cor pulmonale.  SIGNIFICANT EVENTS: 5/29 Admit ICU  5/31 changed to SDU status   STUDIES:   LINES / TUBES:   CULTURES: None  ANTIBIOTICS: None   SUBJECTIVE:  Alert. No distress. Tolerates off BiPAP  VITAL SIGNS: Temp:  [98.3 F (36.8 C)-99.7 F (37.6 C)] 99.4 F (37.4 C) (05/31 1200) Pulse Rate:  [50-98] 84 (05/31 1503) Resp:  [15-36] 16 (05/31 1503) BP: (119-180)/(58-129) 176/59 mmHg (05/31 1006) SpO2:  [89 %-100 %] 94 % (05/31 1100)  HEMODYNAMICS:    VENTILATOR SETTINGS:    INTAKE / OUTPUT: Intake/Output     05/30 0701 - 05/31 0700 05/31 0701 - 06/01 0700   P.O. 240 720   Other 160    IV Piggyback 100 50   Total Intake(mL/kg) 500 (2.2) 770 (3.4)   Urine (mL/kg/hr) 3710 (0.7) 1500 (0.7)   Total Output 3710 1500   Net -3210 -730          PHYSICAL EXAMINATION: General:  Morbidly obese, NAD, Cognition intact Neuro: No focal deficits HEENT:  PERRL Cardiovascular:  RRR s M Lungs:  No adventitious sounds noted Abdomen: Very obese, soft, NT Ext: warm, symmetric edema   LABS: BMET    Component Value Date/Time   NA 135 07/13/2012 0528   K 5.2* 07/13/2012 0528   CL 94* 07/13/2012 0528   CO2 37* 07/13/2012 0528   GLUCOSE 109* 07/13/2012 0528   BUN 15 07/13/2012 0528   CREATININE 1.39* 07/13/2012 0528   CALCIUM 9.1 07/13/2012 0528   GFRNONAA 60* 07/13/2012 0528   GFRAA 69* 07/13/2012 0528    CBC    Component Value Date/Time   WBC 5.8 07/13/2012 0528   RBC 4.41 07/13/2012 0528   HGB 13.0 07/13/2012 0528   HCT  44.0 07/13/2012 0528   PLT 169 07/13/2012 0528   MCV 99.8 07/13/2012 0528   MCH 29.5 07/13/2012 0528   MCHC 29.5* 07/13/2012 0528   RDW 17.2* 07/13/2012 0528   LYMPHSABS 1.6 12/27/2011 1040   MONOABS 0.6 12/27/2011 1040   EOSABS 0.2 12/27/2011 1040   BASOSABS 0.0 12/27/2011 1040      ABG    Component Value Date/Time   PHART 7.276* 07/13/2012 0425   PCO2ART 79.0* 07/13/2012 0425   PO2ART 60.2* 07/13/2012 0425   HCO3 35.5* 07/13/2012 0425   TCO2 32.6 07/13/2012 0425   O2SAT 88.3 07/13/2012 0425     Imaging: Dg Chest Port 1 View  07/13/2012   *RADIOLOGY REPORT*  Clinical Data: Pulmonary edema.  Airspace disease.  PORTABLE CHEST - 1 VIEW  Comparison: Single view of the chest 07/12/2012.  Findings: There is cardiomegaly.  Extensive bilateral airspace disease likely due to congestive heart failure is unchanged.  No pneumothorax identified.  IMPRESSION: No change in extensive bilateral airspace disease likely due to congestive heart failure.   Original Report Authenticated By: Holley Dexter, M.D.     ASSESSMENT / PLAN:  PULMONARY A: Hypercarbic respiratory failure d/t OHS/OSA - much improved Edema pattern on CXR P:   -Change BiPAP to PRN during day and mandatory @ HS -Diuresis.  Repcheck CXR AM 6/1  CARDIOVASCULAR A: Acute on chronic cor pulmonale.     Hypertension. P:  -Cont aggressive diuresis   RENAL A:  Fluid overload P:   -Diuresis as above  GASTROINTESTINAL A:  Morbid obesity P:   -CHO modified diet   HEMATOLOGIC A:  No acute issues P:  -Follow cbc as needed  INFECTIOUS A:  No active issues P:   -monitor clinically  ENDOCRINE A: Mild hyperglycemia >> no prior hx of DM (A1c 7.1 on admission) P:   -cbg q6h while NPO -TSH not elevated on admission   NEUROLOGIC A:  Acute metabolic encephalopathy in the setting of hypercarbic respiratory failure; improving with Bipap. P:   -Bipap as above -minimize use of sedative agents  Billy Fischer, MD ; [facility]  service Mobile [phone].  After 5:30 PM or weekends, call [phone]

## 2012-07-15 ENCOUNTER — Inpatient Hospital Stay (HOSPITAL_COMMUNITY): Payer: Medicaid Other

## 2012-07-15 DIAGNOSIS — E873 Alkalosis: Secondary | ICD-10-CM

## 2012-07-15 DIAGNOSIS — E662 Morbid (severe) obesity with alveolar hypoventilation: Secondary | ICD-10-CM

## 2012-07-15 LAB — BLOOD GAS, ARTERIAL
Acid-Base Excess: 9.1 mmol/L — ABNORMAL HIGH (ref 0.0–2.0)
Bicarbonate: 37.5 mEq/L — ABNORMAL HIGH (ref 20.0–24.0)
Drawn by: 244901
O2 Content: 2 L/min
O2 Saturation: 85.1 %
TCO2: 33.8 mmol/L (ref 0–100)
pCO2 arterial: 71.8 mmHg (ref 35.0–45.0)
pO2, Arterial: 52.4 mmHg — ABNORMAL LOW (ref 80.0–100.0)

## 2012-07-15 LAB — BASIC METABOLIC PANEL
CO2: 41 mEq/L (ref 19–32)
Calcium: 9.3 mg/dL (ref 8.4–10.5)
Chloride: 93 mEq/L — ABNORMAL LOW (ref 96–112)
Creatinine, Ser: 1.03 mg/dL (ref 0.50–1.35)
Glucose, Bld: 100 mg/dL — ABNORMAL HIGH (ref 70–99)
Sodium: 136 mEq/L (ref 135–145)

## 2012-07-15 LAB — GLUCOSE, CAPILLARY: Glucose-Capillary: 125 mg/dL — ABNORMAL HIGH (ref 70–99)

## 2012-07-15 MED ORDER — FUROSEMIDE 40 MG PO TABS
40.0000 mg | ORAL_TABLET | Freq: Two times a day (BID) | ORAL | Status: DC
  Administered 2012-07-15 – 2012-07-20 (×10): 40 mg via ORAL
  Filled 2012-07-15 (×13): qty 1

## 2012-07-15 MED ORDER — ACETAZOLAMIDE SODIUM 500 MG IJ SOLR
500.0000 mg | Freq: Once | INTRAMUSCULAR | Status: AC
  Administered 2012-07-15: 500 mg via INTRAVENOUS
  Filled 2012-07-15: qty 500

## 2012-07-15 NOTE — Progress Notes (Signed)
Pt keeps pulling bipap off & O2 out of his nose. When I try to put back on he starts fighting & swinging at me. RN & I able to get O2 back in, but pt will not put bipap back on.  Jacqulynn Cadet RRT

## 2012-07-15 NOTE — Progress Notes (Signed)
On assessment of pt.he was found to be confused, somewhat combative and pulling BiPAP off. Attempted to explain need for BiPAP and reorient pt. But was unsuccessful. Call placed to [facility] and situation explained to MD who looked in on pt. Via room camera. Orders received for ABG to assess pt for hypercarbia-- pt. Placed on [place] at 2 liters. While assisting Resp. Therapist with ABG pt. Appeared to be more alert and able to respond to questions appropriately . Will continue to monitor and update MD as needed.

## 2012-07-15 NOTE — Progress Notes (Signed)
PULMONARY  / CRITICAL CARE MEDICINE  Name: Aaron Bishop MRN: [id number] DOB: 09-02-1965    ADMISSION DATE:  07/12/2012 CONSULTATION DATE:  07/11/12  REFERRING MD :  EDP PRIMARY SERVICE: CCM  CHIEF COMPLAINT:  Hypercarbia  BRIEF PATIENT DESCRIPTION: 47 year old male smoker, with morbid obesity and hypertension, admitted 5/29 with fluid volume overload and hypercarbic respiratory failure secondary to decompensated OSA/OHS and acute on chronic cor pulmonale.  SIGNIFICANT EVENTS: 5/29 Admit ICU  5/31 changed to SDU status   STUDIES:   LINES / TUBES:   CULTURES: None  ANTIBIOTICS: None   SUBJECTIVE:  Alert. No distress. Tolerates off BiPAP  VITAL SIGNS: Temp:  [98.1 F (36.7 C)-99.6 F (37.6 C)] 98.2 F (36.8 C) (06/01 1200) Pulse Rate:  [61-91] 61 (06/01 1400) Resp:  [16-41] 41 (06/01 1400) BP: (116-173)/(64-101) 151/69 mmHg (06/01 1049) SpO2:  [88 %-100 %] 88 % (06/01 1400) FiO2 (%):  [40 %] 40 % (05/31 2315)  HEMODYNAMICS:    VENTILATOR SETTINGS: Vent Mode:  [-]  FiO2 (%):  [40 %] 40 %  INTAKE / OUTPUT: Intake/Output     05/31 0701 - 06/01 0700 06/01 0701 - 06/02 0700   P.O. 1200 440   Other 180    IV Piggyback 100    Total Intake(mL/kg) 1480 (6.6) 440 (2)   Urine (mL/kg/hr) 5055 (0.9) 800 (0.5)   Total Output 5055 800   Net -3575 -360          PHYSICAL EXAMINATION: General:  Morbidly obese, NAD, Cognition intact Neuro: No focal deficits HEENT:  PERRL Cardiovascular:  RRR s M Lungs:  No adventitious sounds noted Abdomen: Very obese, soft, NT Ext: warm, symmetric edema   LABS: BMET    Component Value Date/Time   NA 136 07/15/2012 0749   K 4.2 07/15/2012 0749   CL 93* 07/15/2012 0749   CO2 41* 07/15/2012 0749   GLUCOSE 100* 07/15/2012 0749   BUN 12 07/15/2012 0749   CREATININE 1.03 07/15/2012 0749   CALCIUM 9.3 07/15/2012 0749   GFRNONAA 86* 07/15/2012 0749   GFRAA >90 07/15/2012 0749    CBC    Component Value Date/Time   WBC 5.8 07/13/2012 0528    RBC 4.41 07/13/2012 0528   HGB 13.0 07/13/2012 0528   HCT 44.0 07/13/2012 0528   PLT 169 07/13/2012 0528   MCV 99.8 07/13/2012 0528   MCH 29.5 07/13/2012 0528   MCHC 29.5* 07/13/2012 0528   RDW 17.2* 07/13/2012 0528   LYMPHSABS 1.6 12/27/2011 1040   MONOABS 0.6 12/27/2011 1040   EOSABS 0.2 12/27/2011 1040   BASOSABS 0.0 12/27/2011 1040      ABG    Component Value Date/Time   PHART 7.276* 07/13/2012 0425   PCO2ART 79.0* 07/13/2012 0425   PO2ART 60.2* 07/13/2012 0425   HCO3 35.5* 07/13/2012 0425   TCO2 32.6 07/13/2012 0425   O2SAT 88.3 07/13/2012 0425     CXR:  Poor quality due to obesity. Possible bilateral infiltrates    ASSESSMENT / PLAN:  PULMONARY A: Hypercarbic respiratory failure d/t OHS/OSA - much improved Edema pattern on CXR P:   -Cont PRN BiPAP during day and mandatory @ HS -Cont diuresis as permitted by BP and renal function  CARDIOVASCULAR A: Acute on chronic cor pulmonale.     Hypertension. P:  -Cont aggressive diuresis   RENAL A:  Fluid overload Chronic compensatory metabolic alkalosis exacerbated by loop diuresis P:   -Acetazolamide x 1 6/01 -scheduled BID lasix and spironolactone  GASTROINTESTINAL A:  Morbid obesity P:   -CHO modified diet   HEMATOLOGIC A:  No acute issues P:  -Follow cbc as needed  INFECTIOUS A:  No active issues P:   -monitor clinically  ENDOCRINE A: Mild hyperglycemia, diet controlled >> no prior documented hx of DM Elevated A1C (7.1)  P:   -D/C SSI    NEUROLOGIC A:  Acute metabolic encephalopathy in the setting of hypercarbic respiratory failure, resolved P:   -Bipap as above -minimize use of sedative agents  Billy Fischer, MD ; [facility] service Mobile [phone].  After 5:30 PM or weekends, call [phone]

## 2012-07-16 LAB — BASIC METABOLIC PANEL
BUN: 10 mg/dL (ref 6–23)
CO2: 37 mEq/L — ABNORMAL HIGH (ref 19–32)
Calcium: 9.5 mg/dL (ref 8.4–10.5)
Chloride: 94 mEq/L — ABNORMAL LOW (ref 96–112)
Creatinine, Ser: 1.04 mg/dL (ref 0.50–1.35)

## 2012-07-16 MED ORDER — MUPIROCIN 2 % EX OINT
1.0000 "application " | TOPICAL_OINTMENT | Freq: Two times a day (BID) | CUTANEOUS | Status: DC
  Administered 2012-07-16 – 2012-07-20 (×9): 1 via NASAL
  Filled 2012-07-16 (×2): qty 22

## 2012-07-16 MED ORDER — CHLORHEXIDINE GLUCONATE CLOTH 2 % EX PADS
6.0000 | MEDICATED_PAD | Freq: Every day | CUTANEOUS | Status: DC
  Administered 2012-07-17 – 2012-07-20 (×3): 6 via TOPICAL

## 2012-07-16 MED ORDER — METFORMIN HCL 500 MG PO TABS
500.0000 mg | ORAL_TABLET | Freq: Every day | ORAL | Status: DC
  Administered 2012-07-17 – 2012-07-20 (×4): 500 mg via ORAL
  Filled 2012-07-16 (×6): qty 1

## 2012-07-16 NOTE — Progress Notes (Signed)
CARE MANAGEMENT NOTE 07/16/2012  Patient:  Aaron Bishop, Aaron Bishop   Account Number:  [id number]  Date Initiated:  07/13/2012  Documentation initiated by:  Chelsia Serres  Subjective/Objective Assessment:   several days of increasing dyspnea, and increasing lethgary     Action/Plan:   lives at home alone, support is the mother   Anticipated DC Date:  07/19/2012   Anticipated DC Plan:  HOME/SELF CARE  In-house referral  Financial Counselor      DC Planning Services  CM consult  Medication Assistance      [facility] Choice  NA   Choice offered to / List presented to:  NA   DME arranged  BIPAP      DME agency  [facility]     HH arranged  NA      HH agency  NA   Status of service:  In process, will continue to follow Medicare Important Message given?  NA - LOS <3 / Initial given by admissions (If response is "NO", the following Medicare IM given date fields will be blank) Date Medicare IM given:   Date Additional Medicare IM given:    Discharge Disposition:    Per UR Regulation:  Reviewed for med. necessity/level of care/duration of stay  If discussed at Long Length of Stay Meetings, dates discussed:    Comments:  [id number]/Mizael Sagar Earlene Plater, RN, BSN, CCM:  CHART REVIEWED AND UPDATED.  Next chart review due on [id number]. [facility] notified of the bipap for home need and that patient is self pay. CASE MANAGEMENT [phone]   [id number]/LKGMWN Earlene Plater, RN, BSN, CCM:  CHART REVIEWED AND UPDATED.  Next chart review due on [id number]. NO DISCHARGE NEEDS PRESENT AT THIS TIME. CASE MANAGEMENT [phone]

## 2012-07-16 NOTE — Progress Notes (Signed)
Nutrition Brief Note  - Attempted to educate pt on diet therapy for weight loss, however after multiple times of asking pt questions about his eating habits, he continued to fall asleep. RN reports pt with history of non-compliance and got in report that pt frequently called family to bring in additional snacks while in the ICU.  RD to attempt education at a later date as schedule allows.   Levon Hedger MS, RD, LDN [phone] Pager [phone] After Hours Pager

## 2012-07-16 NOTE — Progress Notes (Signed)
PULMONARY  / CRITICAL CARE MEDICINE  Name: Aaron Bishop MRN: [id number] DOB: [birth year]    ADMISSION DATE:  07/12/2012 CONSULTATION DATE:  07/11/12  REFERRING MD :  EDP PRIMARY SERVICE: CCM  CHIEF COMPLAINT:  Hypercarbia  BRIEF PATIENT DESCRIPTION: 47 year old male smoker, with morbid obesity and hypertension, admitted 5/29 with fluid volume overload and hypercarbic respiratory failure secondary to decompensated OSA/OHS and acute on chronic cor pulmonale.  SIGNIFICANT EVENTS: 5/29 Admit ICU  5/31 changed to SDU status 6-2 tx to floor  STUDIES:  5/30 Echo >> mild LVH, EF 55 to 60%  SUBJECTIVE:  Alert. No distress. Tolerates off BiPAP.  C/o hand shaking.  VITAL SIGNS: Temp:  [97.7 F (36.5 C)-98.8 F (37.1 C)] 97.7 F (36.5 C) (06/02 0800) Pulse Rate:  [61-90] 84 (06/02 0843) Resp:  [16-41] 30 (06/02 0843) BP: (130-173)/(55-106) 161/74 mmHg (06/02 0843) SpO2:  [78 %-98 %] 98 % (06/02 0843) FiO2 (%):  [40 %] 40 % (06/02 0000) 5 liters [place]  INTAKE / OUTPUT: Intake/Output     06/01 0701 - 06/02 0700 06/02 0701 - 06/03 0700   P.O. 760    Other     IV Piggyback     Total Intake(mL/kg) 760 (3.4)    Urine (mL/kg/hr) 2150 (0.4)    Total Output 2150     Net -1390          Urine Occurrence 1 x      PHYSICAL EXAMINATION: General:  Morbidly obese, NAD, Cognition intact Neuro: No focal deficits HEENT:  PERRL Cardiovascular:  RRR s M Lungs:  No adventitious sounds noted Abdomen: Very obese, soft, NT Ext: warm, symmetric edema   LABS: BMET    Component Value Date/Time   NA 135 07/16/2012 0655   K 5.0 07/16/2012 0655   CL 94* 07/16/2012 0655   CO2 37* 07/16/2012 0655   GLUCOSE 106* 07/16/2012 0655   BUN 10 07/16/2012 0655   CREATININE 1.04 07/16/2012 0655   CALCIUM 9.5 07/16/2012 0655   GFRNONAA 85* 07/16/2012 0655   GFRAA >90 07/16/2012 0655    CBC    Component Value Date/Time   WBC 5.8 07/13/2012 0528   RBC 4.41 07/13/2012 0528   HGB 13.0 07/13/2012 0528   HCT 44.0  07/13/2012 0528   PLT 169 07/13/2012 0528   MCV 99.8 07/13/2012 0528   MCH 29.5 07/13/2012 0528   MCHC 29.5* 07/13/2012 0528   RDW 17.2* 07/13/2012 0528   LYMPHSABS 1.6 12/27/2011 1040   MONOABS 0.6 12/27/2011 1040   EOSABS 0.2 12/27/2011 1040   BASOSABS 0.0 12/27/2011 1040      ABG    Component Value Date/Time   PHART 7.337* 07/15/2012 2008   PCO2ART 71.8* 07/15/2012 2008   PO2ART 52.4* 07/15/2012 2008   HCO3 37.5* 07/15/2012 2008   TCO2 33.8 07/15/2012 2008   O2SAT 85.1 07/15/2012 2008     Imaging: Dg Chest Port 1 View  07/15/2012   *RADIOLOGY REPORT*  Clinical Data: Respiratory failure  PORTABLE CHEST - 1 VIEW  Comparison: 07/13/2012  Findings: Obesity.  Low lung volumes with resultant crowding of bronchovascular structures.  Bilateral diffuse airspace opacities stable.  No definite effusion.  Cardiomegaly.  IMPRESSION:  1.  Little change in bilateral airspace disease, emphasized by low lung volumes.   Original Report Authenticated By: D. Andria Rhein, MD    CBG (last 3)   Recent Labs  07/14/12 2006 07/14/12 2336 07/15/12 0739  GLUCAP 113* 125* 81  ASSESSMENT / PLAN:  PULMONARY A: Acute on chronic hypoxic/hypercapnic respiratory failure from OSA, OHS, and acute on chronic cor pulmonale. P:   -change to auto BiPAP qhs -oxygen as needed to keep SpO2 88 to 92%   CARDIOVASCULAR A: Acute on chronic cor pulmonale. Hx of hypertension. P:  -continue lasix, aldactone  RENAL A: Increased potassium >> remains in normal range. P:   -monitor electrolytes closely while on aldactone  GASTROINTESTINAL A:  Morbid obesity. P:   -CHO modified diet -nutrition to assess diet needs to assist with weight loss -may need bariatric surgery evaluation as outpt  ENDOCRINE A: Mild hyperglycemia, diet controlled >> no prior documented hx of DM Elevated A1C (7.1)  P:   -start metformin 6/02  NEUROLOGIC A:  Acute metabolic encephalopathy in the setting of hypercarbic respiratory  failure >> resolved Deconditioning. P:   -monitor mental status -PT/OT evaluation  Disposition >> transfer to telemetry.  Will ask social work to assess for financial assistance.  Brett Canales Minor ACNP [facility] PCCM Pager [phone] till 3 pm If no answer page [phone] 07/16/2012, 10:26 AM  Coralyn Helling, MD [facility] Pulmonary/Critical Care 07/16/2012, 11:07 AM Pager:  [phone] After 3pm call: [phone]

## 2012-07-16 NOTE — Evaluation (Signed)
Physical Therapy Evaluation Patient Details Name: Aaron Bishop MRN: [id number] DOB: May 07, 1965 Today's Date: 07/16/2012 Time: 7829-5621 PT Time Calculation (min): 15 min  PT Assessment / Plan / Recommendation Clinical Impression  Pt is a 47 year old male admitted for fluid volume overload and hypercarbic respiratory failure secondary to decompensated OSA/OHS and acute on chronic cor pulmonale with hx of morbid obesity.  Pt would benefit from acute PT services in order to improve indepedence with transfers and ambulation while monitoring SOB and SaO2 during physical activity in preparation for d/c home.     PT Assessment  Patient needs continued PT services    Follow Up Recommendations  Home health PT    Does the patient have the potential to tolerate intense rehabilitation      Barriers to Discharge        Equipment Recommendations  None recommended by PT    Recommendations for Other Services     Frequency Min 3X/week    Precautions / Restrictions Precautions Precautions: Fall Precaution Comments: monitor sats   Pertinent Vitals/Pain SaO2 on 4L O2 [place] at rest 98% SaO2 on 4L O2 [place] during ambulation 86% SaO2 on 4L O2  upon return to recliner/rest 95%     Mobility  Bed Mobility Bed Mobility: Supine to Sit Supine to Sit: 6: Modified independent (Device/Increase time);HOB flat Details for Bed Mobility Assistance: uses momentum to assist himself Transfers Transfers: Sit to Stand;Stand to Sit Sit to Stand: 4: Min guard;From bed Stand to Sit: 4: Min guard;To chair/3-in-1 Details for Transfer Assistance: min/guard for safety Ambulation/Gait Ambulation/Gait Assistance: 4: Min guard Ambulation Distance (Feet): 160 Feet Assistive device: None Ambulation/Gait Assistance Details: pt with increased lateral trunk lean gait pattern due to body habitus however moves well only limited by DOE, pt on 4L O2 during gait and SaO2 dropped to 86% so took standing rest break and performed  pursed lip breathing with increase back up to 93% prior to ambulating back to room Gait Pattern: Step-through pattern;Decreased stride length;Lateral trunk lean to right;Lateral trunk lean to left General Gait Details: pt reports more dyspnea compared to prior to admission    Exercises     PT Diagnosis: Difficulty walking  PT Problem List: Decreased activity tolerance;Decreased mobility;Cardiopulmonary status limiting activity;Obesity PT Treatment Interventions: DME instruction;Gait training;Stair training;Functional mobility training;Therapeutic activities;Therapeutic exercise;Patient/family education   PT Goals Acute Rehab PT Goals PT Goal Formulation: With patient Time For Goal Achievement: 07/30/12 Potential to Achieve Goals: Good Pt will go Sit to Stand: with modified independence PT Goal: Sit to Stand - Progress: Goal set today Pt will Ambulate: 51 - 150 feet;with modified independence (dyspnea 1/4) PT Goal: Ambulate - Progress: Goal set today Pt will Go Up / Down Stairs: 3-5 stairs;with supervision;with rail(s) PT Goal: Up/Down Stairs - Progress: Goal set today Pt will Perform Home Exercise Program: with supervision, verbal cues required/provided PT Goal: Perform Home Exercise Program - Progress: Goal set today  Visit Information  Last PT Received On: 07/16/12 Assistance Needed: +1    Subjective Data  Subjective: Come back and walk with me anytime. Patient Stated Goal: home   Prior Functioning  Home Living Lives With: Alone Type of Home: House Home Access: Stairs to enter Secretary/administrator of Steps: 5-6 Entrance Stairs-Rails: Right Home Layout: One level Home Adaptive Equipment: None Additional Comments: Pt states significant other is usually home with him Prior Function Level of Independence: Independent Comments: usually on 2L O2 at home Communication Communication: No difficulties    Cognition  Cognition Arousal/Alertness: Awake/alert Behavior During  Therapy: WFL for tasks assessed/performed Overall Cognitive Status: Within Functional Limits for tasks assessed    Extremity/Trunk Assessment Right Lower Extremity Assessment RLE ROM/Strength/Tone: WFL for tasks assessed Left Lower Extremity Assessment LLE ROM/Strength/Tone: WFL for tasks assessed Trunk Assessment Trunk Assessment: Normal   Balance    End of Session PT - End of Session Equipment Utilized During Treatment: Oxygen Activity Tolerance: Patient limited by fatigue Patient left: in chair;with call bell/phone within reach Nurse Communication: Mobility status;Other (comment) (up in recliner (tech))  GP     Timeka Goette,KATHrine E 07/16/2012, 4:15 PM Zenovia Jarred, PT, DPT 07/16/2012 Pager: [phone]

## 2012-07-17 LAB — BASIC METABOLIC PANEL WITH GFR
BUN: 13 mg/dL (ref 6–23)
CO2: 33 meq/L — ABNORMAL HIGH (ref 19–32)
Calcium: 9.6 mg/dL (ref 8.4–10.5)
Chloride: 96 meq/L (ref 96–112)
Creatinine, Ser: 1.11 mg/dL (ref 0.50–1.35)
GFR calc Af Amer: >90 mL/min
GFR calc non Af Amer: 79 mL/min — ABNORMAL LOW
Glucose, Bld: 111 mg/dL — ABNORMAL HIGH (ref 70–99)
Potassium: 4.3 meq/L (ref 3.5–5.1)
Sodium: 135 meq/L (ref 135–145)

## 2012-07-17 LAB — PHOSPHORUS: Phosphorus: 4.1 mg/dL (ref 2.3–4.6)

## 2012-07-17 LAB — CBC
HCT: 44.2 % (ref 39.0–52.0)
MCHC: 30.5 g/dL (ref 30.0–36.0)
Platelets: 197 10*3/uL (ref 150–400)
RDW: 16.3 % — ABNORMAL HIGH (ref 11.5–15.5)
WBC: 4.2 10*3/uL (ref 4.0–10.5)

## 2012-07-17 LAB — MAGNESIUM: Magnesium: 2.6 mg/dL — ABNORMAL HIGH (ref 1.5–2.5)

## 2012-07-17 NOTE — Evaluation (Signed)
Occupational Therapy Evaluation Patient Details Name: Aaron Bishop MRN: [id number] DOB: 13-Mar-1965 Today's Date: 07/17/2012 Time: 4098-1191 OT Time Calculation (min): 25 min  OT Assessment / Plan / Recommendation Clinical Impression  This 47 year old man was admitted for acute vs. chronic respiratory failure.  pt was initially very sleepy but became arousable during eval and was able to maintain attention to task.  Amount of assistance at home appears variable, due to how he is feeling.  Will follow in acute for increasing activity tolerance and adls and educating on energy conservation    OT Assessment  Patient needs continued OT Services    Follow Up Recommendations  Home health OT    Barriers to Discharge      Equipment Recommendations   (to be further assessed)    Recommendations for Other Services    Frequency  Min 2X/week    Precautions / Restrictions Precautions Precautions: Fall Precaution Comments: monitor sats Restrictions Weight Bearing Restrictions: No   Pertinent Vitals/Pain No pain.  On 5 liters 02, sats 96%.     ADL  Transfers/Ambulation Related to ADLs: performed sit to stand only--initially wanted to get back to bed, then changed his mind.   ADL Comments: Attempted eval earlier this am and pt could not stay awake.  He was similar this pm.  Called RN and she attempted to put CPAP on but pt woke up and refused.  Sats were 96%.  Pt woke up and participated after this. Pt was unable to hold urinal and it spilled.  He said this was because of his nerves.  Needed total A to change socks.  Will further assess if pt would benefit from AE for energy conservation.  Pt min A for upper body adls due to dropping things--hand strength is good.  Max A for LB ADLs.      OT Diagnosis: Generalized weakness  OT Problem List: Decreased strength;Decreased activity tolerance;Decreased knowledge of use of DME or AE;Cardiopulmonary status limiting activity OT Treatment Interventions:  Self-care/ADL training;DME and/or AE instruction;Patient/family education;Energy conservation   OT Goals Acute Rehab OT Goals OT Goal Formulation: With patient Time For Goal Achievement: 07/31/12 Potential to Achieve Goals: Good ADL Goals Pt Will Transfer to Toilet: with supervision;Ambulation;3-in-1 ADL Goal: Toilet Transfer - Progress: Goal set today Pt Will Perform Toileting - Hygiene: with modified independence;Sit to stand from 3-in-1/toilet ADL Goal: Toileting - Hygiene - Progress: Goal set today Miscellaneous OT Goals Miscellaneous OT Goal #1: Pt will perform LB adls with set up with ae prn OT Goal: Miscellaneous Goal #1 - Progress: Goal set today Miscellaneous OT Goal #2: Pt will initiate rest breaks as needed for energy conservation OT Goal: Miscellaneous Goal #2 - Progress: Goal set today  Visit Information  Last OT Received On: 07/17/12 Assistance Needed: +1    Subjective Data  Subjective: I don't want this on (cpap) Patient Stated Goal: none stated   Prior Functioning     Home Living Lives With: Alone Available Help at Discharge: Friend(s)  Pt states significant other is almost always there Prior Function Level of Independence:  (variable) Comments: sometimes he has difficulty with adls and significant other will help him Communication Communication:  (initially sleepy, could not understand him)         Vision/Perception     Cognition  Cognition Arousal/Alertness: Lethargic (initially could not stay awake) Behavior During Therapy: [facility] for tasks assessed/performed Overall Cognitive Status: Within Functional Limits for tasks assessed    Extremity/Trunk Assessment Right Upper  Extremity Assessment RUE ROM/Strength/Tone: [facility] for tasks assessed (shoulders to 90 bilaterally) Left Upper Extremity Assessment LUE ROM/Strength/Tone: [facility] for tasks assessed     Mobility Transfers Sit to Stand: With armrests;From chair/3-in-1;4: Min guard Details for Transfer  Assistance: for safety     Exercise     Balance     End of Session OT - End of Session Activity Tolerance: Patient limited by fatigue Patient left: in chair;with call bell/phone within reach  GO     [place] 07/17/2012, 2:24 PM Marica Otter, OTR/L [phone] 07/17/2012

## 2012-07-17 NOTE — Progress Notes (Signed)
  RD consulted for nutrition education regarding weight loss.  Body mass index is 77.5 kg/(m^2). Pt meets criteria for Morbid Obesity based on current BMI.  Pt reports that he recently started a diet; he eats oatmeal for breakfast and snacks on fruit the rest of the day with no other meals or snacks.   RD provided "Weight Loss Tips" handout from the Academy of [employer] as well as sample meal plans for a 1800-2200 kcal diet. Discussed importance of controlled and consistent intake throughout the day. Emphasized the importance of hydration with calorie-free beverages and limiting sugar-sweetened beverages. Encouraged pt to discuss physical activity options with physician. Teach back method used. Pt was very lethargic at time of visit and unable to stay focused. Pt states that he can switch to calorie-free beverages and walk more often.   Expect poor compliance. Encouraged pt to review meal plans and weight loss tips. RD to attempt follow-up with pt tomorrow.  Current diet order is Carb Modified, patient is consuming approximately 100% of meals at this time. Labs and medications reviewed. No further nutrition interventions warranted at this time. RD contact information provided. If additional nutrition issues arise, please re-consult RD.  Ian Malkin RD, LDN Inpatient Clinical Dietitian Pager: [phone] After Hours Pager: [phone]

## 2012-07-17 NOTE — Progress Notes (Signed)
PULMONARY  / CRITICAL CARE MEDICINE  Name: Aaron Bishop MRN: [id number] DOB: [birth year]    ADMISSION DATE:  07/12/2012 CONSULTATION DATE:  07/11/12  REFERRING MD :  EDP PRIMARY SERVICE: CCM  CHIEF COMPLAINT:  Hypercarbia  BRIEF PATIENT DESCRIPTION: 46 yobm smoker, with morbid obesity and hypertension, admitted 5/29 with fluid volume overload and hypercarbic respiratory failure secondary to decompensated OSA/OHS and acute on chronic cor pulmonale.  SIGNIFICANT EVENTS: 5/29 Admit ICU  5/31 changed to SDU status 6-2 tx to floor  STUDIES:  5/30 Echo >> mild LVH, EF 55 to 60%  SUBJECTIVE:  Alert. No distress. Tolerates off BiPAP sitting in chair but even then has periods of ams.  C/o hand shaking.  VITAL SIGNS: Temp:  [98.3 F (36.8 C)-100 F (37.8 C)] 98.3 F (36.8 C) (06/03 0505) Pulse Rate:  [85-96] 85 (06/03 0505) Resp:  [23-28] 24 (06/03 0505) BP: (113-155)/(41-76) 155/71 mmHg (06/03 0505) SpO2:  [94 %-98 %] 94 % (06/03 0505) 5 liters [place]  INTAKE / OUTPUT: Intake/Output     06/02 0701 - 06/03 0700 06/03 0701 - 06/04 0700   P.O.     Total Intake(mL/kg)     Urine (mL/kg/hr) 1250 (0.2)    Total Output 1250     Net -1250            PHYSICAL EXAMINATION: General:  Morbidly obese, NAD, Cognition intact Neuro: No focal deficits, sleeping in chair, almost impossible to wake up. HEENT:  PERRL Cardiovascular:  RRR s M Lungs:  No adventitious sounds noted Abdomen: Very obese, soft, NT Ext: warm, symmetric edema   LABS: BMET    Component Value Date/Time   NA 135 07/17/2012 0400   K 4.3 07/17/2012 0400   CL 96 07/17/2012 0400   CO2 33* 07/17/2012 0400   GLUCOSE 111* 07/17/2012 0400   BUN 13 07/17/2012 0400   CREATININE 1.11 07/17/2012 0400   CALCIUM 9.6 07/17/2012 0400   GFRNONAA 79* 07/17/2012 0400   GFRAA >90 07/17/2012 0400    CBC    Component Value Date/Time   WBC 4.2 07/17/2012 0400   RBC 4.46 07/17/2012 0400   HGB 13.5 07/17/2012 0400   HCT 44.2 07/17/2012 0400   PLT 197  07/17/2012 0400   MCV 99.1 07/17/2012 0400   MCH 30.3 07/17/2012 0400   MCHC 30.5 07/17/2012 0400   RDW 16.3* 07/17/2012 0400   LYMPHSABS 1.6 12/27/2011 1040   MONOABS 0.6 12/27/2011 1040   EOSABS 0.2 12/27/2011 1040   BASOSABS 0.0 12/27/2011 1040      ABG    Component Value Date/Time   PHART 7.337* 07/15/2012 2008   PCO2ART 71.8* 07/15/2012 2008   PO2ART 52.4* 07/15/2012 2008   HCO3 37.5* 07/15/2012 2008   TCO2 33.8 07/15/2012 2008   O2SAT 85.1 07/15/2012 2008     Imaging: No results found.  CBG (last 3)   Recent Labs  07/14/12 2006 07/14/12 2336 07/15/12 0739  GLUCAP 113* 125* 81       ASSESSMENT / PLAN:  PULMONARY A: Acute on chronic hypoxic/hypercapnic respiratory failure from OSA, OHS, complicated by acute on chronic cor pulmonale.   P:   -change to auto BiPAP qhs -oxygen as needed to keep SpO2 88 to 92%   CARDIOVASCULAR A: Acute on chronic cor pulmonale. Hx of hypertension. P:  -continue lasix, aldactone  RENAL  Recent Labs Lab 07/15/12 0749 07/16/12 0655 07/17/12 0400  K 4.2 5.0 4.3     Intake/Output Summary (Last 24 hours)  at 07/17/12 1007 Last data filed at 07/17/12 0001  Gross per 24 hour  Intake      0 ml  Output    650 ml  Net   -650 ml    A: Increased potassium, resolved P:   -monitor electrolytes closely while on aldactone  GASTROINTESTINAL A:  Morbid obesity with nl TSH this adm P:   -CHO modified diet -nutrition to assess diet needs to assist with weight loss -may need bariatric surgery evaluation as outpt  ENDOCRINE A: Mild hyperglycemia, diet controlled >> no prior documented hx of DM Elevated A1C (7.1)  CBG (last 3)   Recent Labs  07/14/12 2006 07/14/12 2336 07/15/12 0739  GLUCAP 113* 125* 81     P:   -started metformin 6/02  NEUROLOGIC A:  Acute metabolic encephalopathy in the setting of hypercarbic respiratory failure >> variable tme 6-3 almost somnolent in chair off cpap Deconditioning. P:   -monitor mental  status. 6-3 somnolent.  -PT/OT evaluation  Disposition >> transferred to telemetry 6-2. He appears to be approaching endstage and may need trach / vent or end of life discussion sooner rather than later.   Will ask social work to assess for financial assistance. Discharge when assistance established.    Sandrea Hughs, MD Pulmonary and Critical Care Medicine [facility] Cell [phone] After 5:30 PM or weekends, call [phone]

## 2012-07-18 DIAGNOSIS — R609 Edema, unspecified: Secondary | ICD-10-CM

## 2012-07-18 NOTE — Progress Notes (Addendum)
Pt refusing to wear BiPap, keeps swinging at RN when trying to replace mask. Pt also refusing nasal canula and swinging his arms at RN when trying to replace it. After much persistence RN able to apply O2.  Earnest Conroy. Clelia Croft, RN

## 2012-07-18 NOTE — Progress Notes (Signed)
Physical Therapy Treatment Patient Details Name: Aaron Bishop MRN: [id number] DOB: 07-09-1965 Today's Date: 07/18/2012 Time: 1150-1230 PT Time Calculation (min): 40 min  PT Assessment / Plan / Recommendation Comments on Treatment Session  Pt  with decreased sats walking. Needs to take rest breaks more often to get sats back up.    Follow Up Recommendations  Home health PT     Does the patient have the potential to tolerate intense rehabilitation     Barriers to Discharge        Equipment Recommendations  None recommended by PT    Recommendations for Other Services    Frequency Min 3X/week   Plan Discharge plan remains appropriate    Precautions / Restrictions Precautions Precautions: Fall Precaution Comments: monitor sats Restrictions Weight Bearing Restrictions: No   Pertinent Vitals/Pain sats dropped to 75 while ambulating. HR 115 on 4 l.    Mobility  Transfers Sit to Stand: 4: Min guard;With upper extremity assist;From chair/3-in-1;From toilet Stand to Sit: 4: Min guard;With upper extremity assist;To toilet;To chair/3-in-1 Details for Transfer Assistance: min verbal cues for hand placement Ambulation/Gait Ambulation/Gait Assistance: 4: Min guard Ambulation Distance (Feet): 160 Feet Assistive device: None Ambulation/Gait Assistance Details: Pt does have lateral trunk leandue to body habitus. Pt with noted 3-4 dyspnea. encourafged pt to slow down pace. On 4 l O2 sats 75%, Recovered with in 60 seconds to 90 %, pursed lip breathing. Gait Pattern: Step-through pattern;Decreased stride length;Lateral trunk lean to right;Lateral trunk lean to left    Exercises     PT Diagnosis:    PT Problem List:   PT Treatment Interventions:     PT Goals Acute Rehab PT Goals Pt will go Sit to Stand: with modified independence PT Goal: Sit to Stand - Progress: Progressing toward goal PT Goal: Ambulate - Progress: Progressing toward goal  Visit Information  Last PT Received On:  07/18/12 Assistance Needed: +1    Subjective Data  Subjective: I have a HA.  Now it is better.   Cognition  Cognition Arousal/Alertness: Awake/alert Behavior During Therapy: WFL for tasks assessed/performed    Balance  Balance Balance Assessed: Yes Dynamic Standing Balance Dynamic Standing - Balance Support: Right upper extremity supported Dynamic Standing - Level of Assistance: 5: Stand by assistance Dynamic Standing - Balance Activities: Forward lean/weight shifting  End of Session PT - End of Session Equipment Utilized During Treatment: Oxygen Activity Tolerance: Patient limited by fatigue Patient left: in chair;with call bell/phone within reach;with chair alarm set Nurse Communication: Mobility status;Other (comment)   GP     Rada Hay 07/18/2012, 1:31 PM Blanchard Kelch PT [phone]

## 2012-07-18 NOTE — Progress Notes (Addendum)
Attempted to replace BiPap mask again, pt still using force and refusing mask. O2 cannula in place. Will continue to monitor and encourage use of BiPap mask.  Earnest Conroy. Clelia Croft, RN

## 2012-07-18 NOTE — Progress Notes (Signed)
RN called Resp Therapy to help with trying to replace BiPap mask. RN and RT were able to get BiPap mask on patient. Will continue to monitor.  Earnest Conroy. Clelia Croft, RN

## 2012-07-18 NOTE — Progress Notes (Addendum)
Pt pulled BiPap mask off again. Pt stating the mask hurts the "back of his head". Pt shouting prophanities and stating that the mask "is not going back on". Educated patient on need to wear Bipap mask and patient still refuses. Nasal cannula replaced on patient. Will continue to monitor.  Earnest Conroy. Clelia Croft, RN

## 2012-07-18 NOTE — Progress Notes (Signed)
Occupational Therapy Treatment Patient Details Name: Aaron Bishop MRN: [id number] DOB: 1965/10/03 Today's Date: 07/18/2012 Time: 4098-1191 OT Time Calculation (min): 41 min  OT Assessment / Plan / Recommendation Comments on Treatment Session Pt educated on toilet aid option for hygiene and importance of not holding his breath with activity and use of PLB when SOB. Pt pleasant and cooperative.     Follow Up Recommendations  Home health OT;Supervision/Assistance - 24 hour    Barriers to Discharge       Equipment Recommendations  None recommended by OT    Recommendations for Other Services    Frequency Min 2X/week   Plan Discharge plan remains appropriate    Precautions / Restrictions Precautions Precautions: Fall Precaution Comments: monitor sats Restrictions Weight Bearing Restrictions: No        ADL  Toilet Transfer: Performed;Min guard Toilet Transfer Equipment: Comfort height toilet;Grab bars Toileting - Clothing Manipulation and Hygiene: Performed;+1 Total assistance (for posterior periarea after BM. pt unable to reach to clean) Where Assessed - Toileting Clothing Manipulation and Hygiene: Sit to stand from 3-in-1 or toilet Equipment Used: Other (comment) (toilet aid) ADL Comments: up to bathroom to have BM. pt able to stand from toilet with grab bar use. Pt states he doesnt feel he needs a 3in1 and can use vanity in front of toilet to stand. He states he has a walk in shower at home but a seat if needed. Educated on toilet aid option as pt not able to clean himself today after a BM. Pt interested in AE for toileting. Sats do decrease with activity. was 98% on 4L in bathroom with toileting but down to 75% after ambulation in hallway on 4L. With rest, increased to 93% quickly. Educated on not holding his breath (he states he tends to hold breath) and PLB.     OT Diagnosis:    OT Problem List:   OT Treatment Interventions:     OT Goals Acute Rehab OT Goals OT Goal  Formulation: With patient Time For Goal Achievement: 07/31/12 Potential to Achieve Goals: Good ADL Goals Pt Will Transfer to Toilet: with supervision;Ambulation;3-in-1 ADL Goal: Toilet Transfer - Progress: Progressing toward goals Pt Will Perform Toileting - Hygiene: with modified independence;Sit to stand from 3-in-1/toilet ADL Goal: Toileting - Hygiene - Progress: Not progressing (total assist due to not being able to reach periarea) Miscellaneous OT Goals Miscellaneous OT Goal #1: Pt will perform LB adls with set up with ae prn Miscellaneous OT Goal #2: Pt will initiate rest breaks as needed for energy conservation OT Goal: Miscellaneous Goal #2 - Progress: Not progressing (had to remind pt to rest and perform PLB)  Visit Information  Last OT Received On: 07/18/12 Assistance Needed: +1 PT/OT Co-Evaluation/Treatment: Yes    Subjective Data  Subjective: i need to poop Patient Stated Goal: go to the bathroom   Prior Functioning       Cognition  Cognition Arousal/Alertness: Awake/alert Behavior During Therapy: WFL for tasks assessed/performed    Mobility  Transfers Transfers: Sit to Stand;Stand to Sit Sit to Stand: 4: Min guard;With upper extremity assist;From chair/3-in-1;From toilet Stand to Sit: 4: Min guard;With upper extremity assist;To toilet;To chair/3-in-1 Details for Transfer Assistance: min verbal cues for hand placement    Exercises      Balance     End of Session OT - End of Session Activity Tolerance: Patient tolerated treatment well;Other (comment) (does desat with activity) Patient left: in chair;with call bell/phone within reach;with chair alarm set  GO  Lennox Laity [phone] 07/18/2012, 1:04 PM

## 2012-07-18 NOTE — Progress Notes (Signed)
PULMONARY  / CRITICAL CARE MEDICINE  Name: Aaron Bishop MRN: [id number] DOB: 08/27/1965    ADMISSION DATE:  07/12/2012 CONSULTATION DATE:  07/11/12  REFERRING MD :  EDP PRIMARY SERVICE: CCM  CHIEF COMPLAINT:  Hypercarbia  BRIEF PATIENT DESCRIPTION: 46 yobm smoker, with morbid obesity and hypertension, admitted 5/29 with fluid volume overload and hypercarbic respiratory failure secondary to decompensated OSA/OHS and acute on chronic cor pulmonale.  SIGNIFICANT EVENTS: 5/29 Admit ICU  5/31 changed to SDU status 6-2 tx to floor  STUDIES:  5/30 Echo >> mild LVH, EF 55 to 60%  SUBJECTIVE:  Non compliant with reported no memory of non compliance.  VITAL SIGNS: Temp:  [98.1 F (36.7 C)-98.5 F (36.9 C)] 98.4 F (36.9 C) (06/04 0647) Pulse Rate:  [81-90] 90 (06/04 0647) Resp:  [18-20] 20 (06/04 0647) BP: (118-148)/(53-87) 148/87 mmHg (06/04 0647) SpO2:  [94 %-95 %] 94 % (06/04 0647) 5 liters [place]  INTAKE / OUTPUT: Intake/Output     06/03 0701 - 06/04 0700 06/04 0701 - 06/05 0700   P.O. 600    Total Intake(mL/kg) 600 (2.7)    Urine (mL/kg/hr) 1725 (0.3)    Total Output 1725     Net -1125            PHYSICAL EXAMINATION: General:  Morbidly obese, NAD, Cognition intact Neuro: No focal deficits, more awake  HEENT:  PERRL Cardiovascular:  RRR s M Lungs:  No adventitious sounds noted Abdomen: Very obese, soft, NT Ext: warm, symmetric edema   LABS: BMET    Component Value Date/Time   NA 135 07/17/2012 0400   K 4.3 07/17/2012 0400   CL 96 07/17/2012 0400   CO2 33* 07/17/2012 0400   GLUCOSE 111* 07/17/2012 0400   BUN 13 07/17/2012 0400   CREATININE 1.11 07/17/2012 0400   CALCIUM 9.6 07/17/2012 0400   GFRNONAA 79* 07/17/2012 0400   GFRAA >90 07/17/2012 0400    CBC    Component Value Date/Time   WBC 4.2 07/17/2012 0400   RBC 4.46 07/17/2012 0400   HGB 13.5 07/17/2012 0400   HCT 44.2 07/17/2012 0400   PLT 197 07/17/2012 0400   MCV 99.1 07/17/2012 0400   MCH 30.3 07/17/2012 0400   MCHC  30.5 07/17/2012 0400   RDW 16.3* 07/17/2012 0400   LYMPHSABS 1.6 12/27/2011 1040   MONOABS 0.6 12/27/2011 1040   EOSABS 0.2 12/27/2011 1040   BASOSABS 0.0 12/27/2011 1040      ABG    Component Value Date/Time   PHART 7.337* 07/15/2012 2008   PCO2ART 71.8* 07/15/2012 2008   PO2ART 52.4* 07/15/2012 2008   HCO3 37.5* 07/15/2012 2008   TCO2 33.8 07/15/2012 2008   O2SAT 85.1 07/15/2012 2008     Imaging: No results found.  CBG (last 3)  No results found for this basename: GLUCAP,  in the last 72 hours     ASSESSMENT / PLAN:  PULMONARY A: Acute on chronic hypoxic/hypercapnic respiratory failure from OSA, OHS, complicated by acute on chronic cor pulmonale.   P:   -cont auto BiPAP qhs -oxygen as needed to keep SpO2 88 to 92% -consider trach  CARDIOVASCULAR A: Acute on chronic cor pulmonale. Hx of hypertension. P:  -continue lasix, aldactone  RENAL  Recent Labs Lab 07/15/12 0749 07/16/12 0655 07/17/12 0400  K 4.2 5.0 4.3     Intake/Output Summary (Last 24 hours) at 07/18/12 0900 Last data filed at 07/18/12 4098  Gross per 24 hour  Intake  600 ml  Output   1400 ml  Net   -800 ml    A: Increased potassium, resolved P:   -monitor electrolytes closely while on aldactone  GASTROINTESTINAL A:  Morbid obesity with nl TSH this adm P:   -CHO modified diet -nutrition to assess diet needs to assist with weight loss -may need bariatric surgery evaluation as outpt  ENDOCRINE A: Mild hyperglycemia, diet controlled >> no prior documented hx of DM Elevated A1C (7.1)  CBG (last 3)  No results found for this basename: GLUCAP,  in the last 72 hours   P:   -started metformin 6/02(glucose 111 on 6-3  NEUROLOGIC A:  Acute metabolic encephalopathy in the setting of hypercarbic respiratory failure >> variable tme 6-3 almost somnolent in chair off cpap Deconditioning. P:   -monitor mental status. 6-3 somnolent.  -PT/OT evaluation  Disposition >> transferred to telemetry  6-2. He appears to be approaching endstage and may need trach / vent or end of life discussion sooner rather than later.   Will ask social work to assess for financial assistance. Discharge when assistance established.  6-4 he wore cpap x 3 hours but refused to wear after that. May consider ENT consult and tracheostomy. Can dc home in next 24 hrs if not trached.  Sandrea Hughs, MD Pulmonary and Critical Care Medicine [facility] Cell [phone] After 5:30 PM or weekends, call [phone]

## 2012-07-19 LAB — BASIC METABOLIC PANEL
Calcium: 9.7 mg/dL (ref 8.4–10.5)
GFR calc Af Amer: >90 mL/min (ref >90)
GFR calc non Af Amer: >90 mL/min (ref >90)
Potassium: 4.4 mEq/L (ref 3.5–5.1)
Sodium: 134 mEq/L — ABNORMAL LOW (ref 135–145)

## 2012-07-19 MED ORDER — SPIRONOLACTONE 50 MG PO TABS: 50.0000 mg | ORAL_TABLET | Freq: Two times a day (BID) | ORAL | Status: DC

## 2012-07-19 MED ORDER — METFORMIN HCL 500 MG PO TABS: 500.0000 mg | ORAL_TABLET | Freq: Every day | ORAL | Status: AC

## 2012-07-19 NOTE — Progress Notes (Signed)
Patient placed the BiPAP machine on his face all on his own. RN connected the tubing to the oxygen and patient is tolerarting the mask well tonight.

## 2012-07-19 NOTE — Progress Notes (Signed)
Physical Therapy Treatment Patient Details Name: Aaron Bishop MRN: [id number] DOB: [birth year] Today's Date: 07/19/2012 Time: 6962-9528 PT Time Calculation (min): 17 min  PT Assessment / Plan / Recommendation Comments on Treatment Session  Pt admitted for fluid overlaod and dyspnea.  Currently on 4 lts nasal at rest 94% but with activity/amb decreased to 83%.  Pt reports he feels "alittle better".     Follow Up Recommendations  Home health PT     Does the patient have the potential to tolerate intense rehabilitation     Barriers to Discharge        Equipment Recommendations  None recommended by PT    Recommendations for Other Services    Frequency Min 3X/week   Plan Discharge plan remains appropriate    Precautions / Restrictions Precautions Precautions: Fall Precaution Comments: monitor sats   Pertinent Vitals/Pain No c/o pain 4 lts O2 (see above)    Mobility  Bed Mobility Bed Mobility: Not assessed Details for Bed Mobility Assistance: Pt OOB in recliner Transfers Transfers: Sit to Stand;Stand to Sit Sit to Stand: 6: Modified independent (Device/Increase time);From chair/3-in-1 Stand to Sit: 6: Modified independent (Device/Increase time);To chair/3-in-1 Details for Transfer Assistance: Pt uses forward momentum due to BMI Ambulation/Gait Ambulation/Gait Assistance: 4: Min guard;5: Supervision Ambulation Distance (Feet): 275 Feet Assistive device: None Ambulation/Gait Assistance Details: On 4 lts O2 sats decreased to 82% and HR increased to 126.  25% VC's to decrease gait speed and increase deep breathing.  Gait Pattern: Step-through pattern;Decreased stride length;Lateral trunk lean to right;Lateral trunk lean to left;Wide base of support General Gait Details: Pt reports dyspnea is "alittle better"     PT Goals                                                              progressing    Visit Information  Last PT Received On: 07/19/12 Assistance Needed: +1     Subjective Data      Cognition  Cognition Arousal/Alertness: Awake/alert Behavior During Therapy: WFL for tasks assessed/performed    Balance  Balance Balance Assessed: Yes Dynamic Standing Balance Dynamic Standing - Balance Support: Left upper extremity supported Dynamic Standing - Level of Assistance: 5: Stand by assistance  End of Session PT - End of Session Equipment Utilized During Treatment: Oxygen Activity Tolerance: Patient tolerated treatment well Patient left: in chair;with call bell/phone within reach;with chair alarm set Nurse Communication: Mobility status;Other (comment)   Felecia Shelling  PTA [facility]  Acute  Rehab Pager      [phone]

## 2012-07-19 NOTE — Progress Notes (Signed)
Occupational Therapy Treatment Patient Details Name: ITZAE MIRALLES MRN: [id number] DOB: March 04, 1965 Today's Date: 07/19/2012 Time: 1050-1106 OT Time Calculation (min): 16 min  OT Assessment / Plan / Recommendation Comments on Treatment Session Pt did well with AE use for toileting. Issued toilet aid tongs for pt and he was very appreciative.    Follow Up Recommendations  Home health OT;Supervision/Assistance - 24 hour    Barriers to Discharge       Equipment Recommendations  None recommended by OT    Recommendations for Other Services    Frequency Min 2X/week   Plan Discharge plan remains appropriate    Precautions / Restrictions Precautions Precautions: Fall Precaution Comments: monitor sats        ADL  Lower Body Dressing: Simulated;Set up (able to don/doff socks at EOC leaning forward. NO AE needed) Where Assessed - Lower Body Dressing: Unsupported sitting Toileting - Clothing Manipulation and Hygiene: Simulated;Min guard (stood to simulate toilet hygiene with toilet tongs. ) Where Assessed - Toileting Clothing Manipulation and Hygiene: Sit to stand from 3-in-1 or toilet ADL Comments: Pt able to stand and simulate hygiene with toilet tongs with min guard assist reaching from the front for periarea cleaning. Pt also able to reach feet for LB dressing so no AE required. He states he doesnt anticipate any difficulty with tub transfer and declined need to practice. He states he has a stool for the tub to sit on as needed also. He has a standard height toilet but a vanity beside and he declines need for a 3in1.      OT Diagnosis:    OT Problem List:   OT Treatment Interventions:     OT Goals Acute Rehab OT Goals OT Goal Formulation: With patient Time For Goal Achievement: 07/31/12 Potential to Achieve Goals: Good ADL Goals Pt Will Transfer to Toilet: with supervision;Ambulation;3-in-1 Pt Will Perform Toileting - Hygiene: with modified independence;Sit to stand from  3-in-1/toilet ADL Goal: Toileting - Hygiene - Progress: Progressing toward goals Miscellaneous OT Goals Miscellaneous OT Goal #1: Pt will perform LB adls with set up with ae prn OT Goal: Miscellaneous Goal #1 - Progress: Progressing toward goals Miscellaneous OT Goal #2: Pt will initiate rest breaks as needed for energy conservation  Visit Information  Last OT Received On: 07/19/12 Assistance Needed: +1    Subjective Data  Subjective: I am doing alright Patient Stated Goal: none stated. agreeable to OT   Prior Functioning       Cognition  Cognition Arousal/Alertness: Awake/alert Behavior During Therapy: WFL for tasks assessed/performed    Mobility  Transfers Transfers: Sit to Stand;Stand to Sit Sit to Stand: 4: Min guard;With upper extremity assist;From chair/3-in-1 Stand to Sit: 4: Min guard;With upper extremity assist;To chair/3-in-1    Exercises      Balance Balance Balance Assessed: Yes Dynamic Standing Balance Dynamic Standing - Balance Support: Left upper extremity supported Dynamic Standing - Level of Assistance: 5: Stand by assistance   End of Session OT - End of Session Activity Tolerance: Patient tolerated treatment well Patient left: in chair;with call bell/phone within reach;with chair alarm set  GO     Lennox Laity [phone] 07/19/2012, 11:16 AM

## 2012-07-19 NOTE — Plan of Care (Signed)
Problem: Phase I Progression Outcomes Goal: O2 sats > or equal 90% or at baseline Outcome: Completed/Met Date Met:  07/19/12 On 4L O2

## 2012-07-19 NOTE — Progress Notes (Signed)
Pt was denied for BIPAP machine/DME with [facility]. He has CPAP and home O2 with [facility].

## 2012-07-19 NOTE — Progress Notes (Signed)
Upon arrival pt resting comfortably on Auto Bipap, humidification/02 bled in. RT will assist as needed.

## 2012-07-19 NOTE — Progress Notes (Signed)
Patient had much better compliance with BiPAP tonight with significant other at bedside.  Earnest Conroy. Clelia Croft, RN

## 2012-07-19 NOTE — Progress Notes (Signed)
PULMONARY  / CRITICAL CARE MEDICINE  Name: Aaron Bishop MRN: [id number] DOB: [birth year]    ADMISSION DATE:  07/12/2012 CONSULTATION DATE:  07/11/12  REFERRING MD :  EDP PRIMARY SERVICE: CCM  CHIEF COMPLAINT:  Hypercarbia  BRIEF PATIENT DESCRIPTION: 46 yobm smoker, with morbid obesity and hypertension, admitted 5/29 with fluid volume overload and hypercarbic respiratory failure secondary to decompensated OSA/OHS and acute on chronic cor pulmonale.  SIGNIFICANT EVENTS: 5/29 Admit ICU  5/31 changed to SDU status 6-2 tx to floor  STUDIES:  5/30 Echo >> mild LVH, EF 55 to 60%  SUBJECTIVE:  nad in chair on nasal 02.  VITAL SIGNS: Temp:  [97.9 F (36.6 C)-99 F (37.2 C)] 97.9 F (36.6 C) (06/05 0630) Pulse Rate:  [85-115] 88 (06/05 0630) Resp:  [16-20] 16 (06/05 0630) BP: (114-153)/(61-86) 153/86 mmHg (06/05 0630) SpO2:  [75 %-100 %] 100 % (06/05 0630) 5 liters [place]  INTAKE / OUTPUT: Intake/Output     06/04 0701 - 06/05 0700 06/05 0701 - 06/06 0700   P.O. 480 240   Total Intake(mL/kg) 480 (2.1) 240 (1.1)   Urine (mL/kg/hr) 1075 (0.2) 500 (1)   Total Output 1075 500   Net -595 -260        Urine Occurrence 1 x      PHYSICAL EXAMINATION: General:  Morbidly obese, NAD, Cognition intact Neuro: No focal deficits, more awake  HEENT:  PERRL Cardiovascular:  RRR s M Lungs:  No adventitious sounds noted Abdomen: Very obese, soft, NT Ext: warm, symmetric edema   LABS: BMET    Component Value Date/Time   NA 134* 07/19/2012 0350   K 4.4 07/19/2012 0350   CL 92* 07/19/2012 0350   CO2 37* 07/19/2012 0350   GLUCOSE 101* 07/19/2012 0350   BUN 10 07/19/2012 0350   CREATININE 0.96 07/19/2012 0350   CALCIUM 9.7 07/19/2012 0350   GFRNONAA >90 07/19/2012 0350   GFRAA >90 07/19/2012 0350    CBC    Component Value Date/Time   WBC 4.2 07/17/2012 0400   RBC 4.46 07/17/2012 0400   HGB 13.5 07/17/2012 0400   HCT 44.2 07/17/2012 0400   PLT 197 07/17/2012 0400   MCV 99.1 07/17/2012 0400   MCH 30.3  07/17/2012 0400   MCHC 30.5 07/17/2012 0400   RDW 16.3* 07/17/2012 0400   LYMPHSABS 1.6 12/27/2011 1040   MONOABS 0.6 12/27/2011 1040   EOSABS 0.2 12/27/2011 1040   BASOSABS 0.0 12/27/2011 1040      ABG    Component Value Date/Time   PHART 7.337* 07/15/2012 2008   PCO2ART 71.8* 07/15/2012 2008   PO2ART 52.4* 07/15/2012 2008   HCO3 37.5* 07/15/2012 2008   TCO2 33.8 07/15/2012 2008   O2SAT 85.1 07/15/2012 2008     Imaging: No results found.  CBG (last 3)  No results found for this basename: GLUCAP,  in the last 72 hours     ASSESSMENT / PLAN:  PULMONARY A: Acute on chronic hypoxic/hypercapnic respiratory failure from OSA, OHS, complicated by acute on chronic cor pulmonale.   P:   -cont auto BiPAP qhs -oxygen as needed to keep SpO2 88 to 92% -consider trach later if worse on bipap  CARDIOVASCULAR A: Acute on chronic cor pulmonale. Hx of hypertension. P:  -continue lasix, aldactone  RENAL  Recent Labs Lab 07/16/12 0655 07/17/12 0400 07/19/12 0350  K 5.0 4.3 4.4     Intake/Output Summary (Last 24 hours) at 07/19/12 0912 Last data filed at 07/19/12 0740  Gross per 24 hour  Intake    720 ml  Output   1575 ml  Net   -855 ml    A: Increased potassium, resolved P:   -monitor electrolytes closely while on aldactone  GASTROINTESTINAL A:  Morbid obesity with nl TSH this adm P:   -CHO modified diet -nutrition to assess diet needs to assist with weight loss -may need bariatric surgery evaluation as outpt  ENDOCRINE A: Mild hyperglycemia, diet controlled >> no prior documented hx of DM Elevated A1C (7.1)  CBG (last 3)  No results found for this basename: GLUCAP,  in the last 72 hours   P:   -started metformin 6/02(glucose 111 on 6-3 -diabetic teaching  NEUROLOGIC A:  Acute metabolic encephalopathy in the setting of hypercarbic respiratory failure >> variable tme 6-3 almost somnolent in chair off cpap Deconditioning. P:   -monitor mental status. 6-5 less  somnolent.  -PT/OT evaluation  He reports wearing bipap during the night x 4 hours, Case manager called for follow up on finances.  [facility] to place bipap machine in home and he will need O2 at 3 l/m bleed in. DME order placed 6-5. Goal is to dc in 24 hours. Follow up with Dr. Vassie Loll June 26th @11 :15 am. Dr. Shana Chute is now his PCP.    Sandrea Hughs, MD Pulmonary and Critical Care Medicine [facility] Cell [phone] After 5:30 PM or weekends, call [phone]

## 2012-07-20 ENCOUNTER — Telehealth: Payer: Self-pay | Admitting: Pulmonary Disease

## 2012-07-20 NOTE — Care Management Note (Signed)
Page 1 of 2   07/20/2012     2:32:40 PM   CARE MANAGEMENT NOTE 07/20/2012  Patient:  Aaron Bishop, Aaron Bishop   Account Number:  [id number]  Date Initiated:  07/13/2012  Documentation initiated by:  Bishop,Aaron  Subjective/Objective Assessment:   several days of increasing dyspnea, and increasing lethgary     Action/Plan:   lives at home alone, support is the mother   Anticipated DC Date:  07/20/2012   Anticipated DC Plan:  HOME/SELF CARE  In-house referral  Financial Counselor      DC Planning Services  CM consult  Medication Assistance  Patient refused services      [facility] Choice  NA   Choice offered to / List presented to:  NA   DME arranged  CPAP      DME agency  [facility]     HH arranged  NA      HH agency  NA   Status of service:  Completed, signed off Medicare Important Message given?  NA - LOS <3 / Initial given by admissions (If response is "NO", the following Medicare IM given date fields will be blank) Date Medicare IM given:   Date Additional Medicare IM given:    Discharge Disposition:  HOME/SELF CARE  Per UR Regulation:  Reviewed for med. necessity/level of care/duration of stay  If discussed at Long Length of Stay Meetings, dates discussed:   07/19/2012    Comments:  07/20/12 Aaron Becvar RN,BSN NCM [phone] LENGTHY CONVERSATION W/DIRECTOR-Aaron Bishop,& PATIENT'S GIRLFRIEND-Aaron Bishop IN CONFERENCE RM ABOUT RESOURCES,& SERVICES.EXPLAINED THAT FINANCIAL COUNSELOR EXPLORES YOUR FINANCIAL SITUATION(ASSESTS,INCOME,CHILDREN) WHEN YOU HAVE NO HEALTH INSURANCE TO EXPLORE WHAT RESOURCES YOU MAY QUALIFY FOR IN REGARDS TO Aaron PAYOR SOURCE FOR BILLING.EXPLAINED THAT CASE MANAGERS EXPLORE WHAT AVAILABLE RESOURCES THERE ARE IN REGARDS TO COMMUNITY RESOURCES, & HH/DME, MED ASSISTANCE.EXPLAINED THAT EVERY CASE IS BASED ON THE INDIVIDUAL'S NEED,& IS Aaron CASE BY CASE ASSESSMENT.EXPLAINED THAT THE PATIENT HAS NO HEALTH INSURANCE & FINANCIAL COUNSELOR WILL F/U ON WHETHER  HE QUALIFIES FOR MEDICAID THROUGH HER PROCESS.SHE NOTED IN EPIC THAT PATIENT HAS NO ASSETS,NO INCOME,NO CHILDREN.EXPLAINED THAT NP-Aaron Bishop HAS BEEN AN ADVOCATE FOR THE PATIENT NEEDING Aaron CPAP MACHINE,& HAS PROVIDED ALL DOCUMENTATION TO JUSTIFY THE NEED FOR Aaron CPAP MACHINE.BUT THE CASE MANAGER HAS NO CONTROL OVER WHETHER THE DME COMPANY-[facility] WILL AUTHORIZE FOR HIM TO RECEIVE Aaron CPAP, EXCEPT ENSURING THAT ALL DOCUMENTATION THAT IS REQUIRED IS PRESENT.EXPLAINED THAT [facility] Aaron Bishop-REGIONAL MANAGER Bishop WOULD LOOK INTO ALL INFO & GET BACK TO Aaron Bishop(I PROVIDED HIS TEL#TO [facility]).PROVIDED HER MY CARD,IF SHE HAD QUESTIONS WHILE SHE WAS IN THE HOSPITAL.SHE VOICED UNDERSTANDING,& LEFT THE RM.RECEIVED CALL FROM Aaron Bishop-FINANCIAL COUNSELOR THAT PATIENT IS MEDICAID PENDING.[facility] Aaron Bishop SPOKE TO PATIENT ABOUT CPAP MACHINE WILL BE @ THE [facility] OFFICE @3 :30P FOR HIM TO P/U,PATIENT STATED HE UNDERSTANDS, & WILL GO THERE WHEN HE IS D/C TODAY.HE ALREADY HAS HOME 02-[facility],& FRIEND(Aaron Bishop) WILL BRING HOME 02 TO HOSPITAL.SPOKE TO PATIENT WHILE HIS MOTHER WAS IN RM TO REITERATE IMPORTANCE OF ADHERING TO ALL D/C INSTRUCTIONS,OTPT APPTS,ESPECIALLY W/PCP,& SPECIALISTS,& TAKING MEDS.EXPLAINED THAT IF HE WANTED Aaron COPY OF HIS MEDICAL RECORDS HE COULD REQUEST IT THROUGH THE MEDICAL RECORDS DEPT.HE VOICED UNDERSTANDING.HE SAYS HE MOTHER ASSISTS HIM W/HIS MEDS.SHE SAID SHE WAS OK HELPING HIM.HE GOES TO [facility]. THERE WERE ONLY 2 NEW MEDS ALDACTONE,& METFORMIN-BOTH ON $4 [facility] LIST,PATIENT STATES HE IS OK W/AFFORDING HIS MEDS.HE WAS RECOMMENDED HHPT-SAYS HE CANT AFFORD THE OUT OF POCKET COST,& DECLINES HHPT.IT  WAS NEVER ORDERED,BUT RECOMMENDED.Aaron Bishop-NP/NURSE Aaron Bishop AWARE,& Bishop.  07/19/12 [name], RN, BSN Pt needs sleep study for BIPAP machine/Home Health.  [id number] Aaron Bishop.  Next chart review due on [id number]. [facility] notified of the bipap for home need and that  patient is self pay. CASE MANAGEMENT [phone]   [id number] Aaron Bishop.  Next chart review due on [id number]. NO DISCHARGE NEEDS PRESENT AT THIS TIME. CASE MANAGEMENT [phone]

## 2012-07-20 NOTE — Discharge Summary (Signed)
Physician Discharge Summary  Patient ID: Aaron Bishop MRN: [id number] DOB/AGE: 1966-01-13 46 y.o.  Admit date: 07/12/2012 Discharge date: 07/20/2012  Problem List Principal Problem:   Acute-on-chronic respiratory failure Active Problems:   OSA (obstructive sleep apnea)   Morbid obesity   Bilateral leg edema   Respiratory failure with hypoxia   Cor pulmonale, acute   Metabolic encephalopathy   Hyperglycemia   Acute pulmonary edema   Obesity hypoventilation syndrome   Metabolic alkalosis  HPI: 46 yobm smoker, with morbid obesity and hypertension, admitted 5/29 with fluid volume overload and hypercarbic respiratory failure secondary to decompensated OSA/OHS and acute on chronic cor pulmonale.  Hospital Course: SIGNIFICANT EVENTS:  5/29 Admit ICU  5/31 changed to SDU status  6-2 tx to floor  STUDIES:  5/30 Echo >> mild LVH, EF 55 to 60%   ASSESSMENT / PLAN:  PULMONARY  A: Acute on chronic hypoxic/hypercapnic respiratory failure from OSA, OHS, complicated by acute on chronic cor pulmonale.  OHS confirmed with ABG  ABG    Component  Value  Date/Time    PHART  7.337*  07/15/2012 2008    PCO2ART  71.8*  07/15/2012 2008    PO2ART  52.4*  07/15/2012 2008    HCO3  37.5*  07/15/2012 2008    TCO2  33.8  07/15/2012 2008    O2SAT  85.1  07/15/2012 2008    Note compensated ph and Pco2 72.  Sleep study 03-30-12  IMPRESSION:  1. Severe obstructive sleep apnea with hypopneas causing sleep  fragmentation and severe oxygen desaturation. Per Dr. Vassie Loll  Note Cpap DID NOT prevent hypoxia during this admit.  P:  -cont auto BiPAP qhs  -oxygen as needed to keep SpO2 88 to 92%  -consider trach later if worse on bipap  -DC home when bipap made available.   CARDIOVASCULAR  A: Acute on chronic cor pulmonale.  Hx of hypertension.  P:  -continue lasix, aldactone  RENAL   Recent Labs  Lab  07/16/12 0655  07/17/12 0400  07/19/12 0350   K  5.0  4.3  4.4     Intake/Output Summary (Last 24  hours) at 07/20/12 1002 Last data filed at 07/20/12 0810   Gross per 24 hour   Intake  1920 ml   Output  1910 ml   Net  10 ml    A: Increased potassium, resolved  P:  -monitor electrolytes closely while on aldactone   GASTROINTESTINAL  A: Morbid obesity with nl TSH this adm(0.813)  P:  -CHO modified diet  -nutrition to assess diet needs to assist with weight loss  -may need bariatric surgery evaluation as outpt   ENDOCRINE  A: Mild hyperglycemia, diet controlled >> no prior documented hx of DM  Elevated A1C (7.1)  CBG (last 3)  No results found for this basename: GLUCAP, in the last 72 hours  P:  -started metformin 6/02(glucose 111 on 6-3  -diabetic teaching   NEUROLOGIC  A: Acute metabolic encephalopathy in the setting of hypercarbic respiratory failure >> variable tme     Deconditioning.  P:  -monitor mental status. 6-6 less somnolent.  -PT/OT evaluation   He reports wearing bipap during the night but has dental cary that is painful on PPV.  Case manager called for follow up on finances.  [facility] to place bipap machine in home and he will need O2 at 3 l/m bleed in. DME order placed 6-5. Denied 6-6 but [facility] re contacted. He meets all criteria  for OHS/Cor pulmonale. [facility] denied request for Bipap therefore will dc on cpap at 17 cm of pressure.  Goal is to dc in 24 hours.  Follow up with Dr. Vassie Loll June 26th @11 :15 am.  Dr. Shana Chute is now his PCP.         Labs at discharge Lab Results  Component Value Date   CREATININE 0.96 07/19/2012   BUN 10 07/19/2012   NA 134* 07/19/2012   K 4.4 07/19/2012   CL 92* 07/19/2012   CO2 37* 07/19/2012   Lab Results  Component Value Date   WBC 4.2 07/17/2012   HGB 13.5 07/17/2012   HCT 44.2 07/17/2012   MCV 99.1 07/17/2012   PLT 197 07/17/2012   No results found for this basename: ALT, AST, GGT, ALKPHOS, BILITOT   No results found for this basename: INR, PROTIME    Current radiology studies No results  found.  Disposition:  01-Home or Self Care  Discharge Orders   Future Appointments Provider Department Dept Phone   08/09/2012 11:30 AM Oretha Milch, MD [facility] [phone]   08/20/2012 10:15 AM Oretha Milch, MD [facility] [phone]   Future Orders Complete By Expires     Discharge patient  As directed         Medication List    STOP taking these medications       potassium chloride 10 MEQ tablet  Commonly known as:  K-DUR      TAKE these medications       albuterol (5 MG/ML) 0.5% nebulizer solution  Commonly known as:  PROVENTIL  Take 0.5 mLs (2.5 mg total) by nebulization every 4 (four) hours as needed for wheezing.     furosemide 40 MG tablet  Commonly known as:  LASIX  TAKE ONE TABLET BY MOUTH TWICE DAILY     loratadine 10 MG tablet  Commonly known as:  CLARITIN  Take 10 mg by mouth daily.     metFORMIN 500 MG tablet  Commonly known as:  GLUCOPHAGE  Take 1 tablet (500 mg total) by mouth daily with breakfast.     spironolactone 50 MG tablet  Commonly known as:  ALDACTONE  Take 1 tablet (50 mg total) by mouth 2 (two) times daily.           Follow-up Information   Follow up with Oretha Milch., MD On 08/09/2012. (11:15 am)    Contact information:   [address] [place] [address] [phone]       Follow up with Pola Corn, MD. (Make an appointment and follow up soon.)    Contact information:   [address] [place] [address] [phone]        Discharged Condition: fair  Time spent on discharge greater than 80 minutes.  Vital signs at Discharge. Temp:  [97.8 F (36.6 C)-98.7 F (37.1 C)] 98 F (36.7 C) (06/06 0452) Pulse Rate:  [82-89] 82 (06/06 0452) Resp:  [16-24] 24 (06/06 0452) BP: (132-141)/(67-73) 137/73 mmHg (06/06 0452) SpO2:  [91 %-100 %] 91 % (06/06 0452)  On NP 4lpm  Office follow up Special Information or instructions. He will need evaluation by Dr. Vassie Loll to obtain BiLevel  NIMVS in future.  > 60 min on discharge planning/dictation  Sandrea Hughs, MD Pulmonary and Critical Care Medicine [facility] Cell [phone] After 5:30 PM or weekends, call [phone]

## 2012-07-20 NOTE — Progress Notes (Signed)
PULMONARY  / CRITICAL CARE MEDICINE  Name: Aaron Bishop MRN: [id number] DOB: [birth year]    ADMISSION DATE:  07/12/2012 CONSULTATION DATE:  07/11/12  REFERRING MD :  EDP PRIMARY SERVICE: CCM  CHIEF COMPLAINT:  Hypercarbia  BRIEF PATIENT DESCRIPTION: 46 yobm smoker, with morbid obesity and hypertension, admitted 5/29 with fluid volume overload and hypercarbic respiratory failure secondary to decompensated OSA/OHS and acute on chronic cor pulmonale.  SIGNIFICANT EVENTS: 5/29 Admit ICU  5/31 changed to SDU status 6-2 tx to floor  STUDIES:  5/30 Echo >> mild LVH, EF 55 to 60%  SUBJECTIVE:  nad in chair on nasal 02.  VITAL SIGNS: Temp:  [97.8 F (36.6 C)-98.7 F (37.1 C)] 98 F (36.7 C) (06/06 0452) Pulse Rate:  [82-89] 82 (06/06 0452) Resp:  [16-24] 24 (06/06 0452) BP: (132-141)/(67-73) 137/73 mmHg (06/06 0452) SpO2:  [91 %-100 %] 91 % (06/06 0452) 5 liters [place]  INTAKE / OUTPUT: Intake/Output     06/05 0701 - 06/06 0700 06/06 0701 - 06/07 0700   P.O. 1080 1080   Total Intake(mL/kg) 1080 (4.8) 1080 (4.8)   Urine (mL/kg/hr) 1985 (0.4) 425 (0.6)   Total Output 1985 425   Net -905 +655        Urine Occurrence 1 x    Stool Occurrence 1 x      PHYSICAL EXAMINATION: General:  Morbidly obese, NAD, Cognition intact Neuro: No focal deficits, more awake  HEENT:  PERRL Cardiovascular:  RRR s M Lungs:  No adventitious sounds noted Abdomen: Very obese, soft, NT Ext: warm, symmetric edema   LABS: BMET    Component Value Date/Time   NA 134* 07/19/2012 0350   K 4.4 07/19/2012 0350   CL 92* 07/19/2012 0350   CO2 37* 07/19/2012 0350   GLUCOSE 101* 07/19/2012 0350   BUN 10 07/19/2012 0350   CREATININE 0.96 07/19/2012 0350   CALCIUM 9.7 07/19/2012 0350   GFRNONAA >90 07/19/2012 0350   GFRAA >90 07/19/2012 0350    CBC    Component Value Date/Time   WBC 4.2 07/17/2012 0400   RBC 4.46 07/17/2012 0400   HGB 13.5 07/17/2012 0400   HCT 44.2 07/17/2012 0400   PLT 197 07/17/2012 0400   MCV 99.1  07/17/2012 0400   MCH 30.3 07/17/2012 0400   MCHC 30.5 07/17/2012 0400   RDW 16.3* 07/17/2012 0400   LYMPHSABS 1.6 12/27/2011 1040   MONOABS 0.6 12/27/2011 1040   EOSABS 0.2 12/27/2011 1040   BASOSABS 0.0 12/27/2011 1040      ABG    Component Value Date/Time   PHART 7.337* 07/15/2012 2008   PCO2ART 71.8* 07/15/2012 2008   PO2ART 52.4* 07/15/2012 2008   HCO3 37.5* 07/15/2012 2008   TCO2 33.8 07/15/2012 2008   O2SAT 85.1 07/15/2012 2008     Imaging: No results found.  CBG (last 3)  No results found for this basename: GLUCAP,  in the last 72 hours     ASSESSMENT / PLAN:  PULMONARY A: Acute on chronic hypoxic/hypercapnic respiratory failure from OSA, OHS, complicated by acute on chronic cor pulmonale. OHS confirmed with ABG ABG    Component Value Date/Time   PHART 7.337* 07/15/2012 2008   PCO2ART 71.8* 07/15/2012 2008   PO2ART 52.4* 07/15/2012 2008   HCO3 37.5* 07/15/2012 2008   TCO2 33.8 07/15/2012 2008   O2SAT 85.1 07/15/2012 2008   Note compensated ph and Pco2 72. Sleep study 03-30-12 IMPRESSION:  1. Severe obstructive sleep apnea with hypopneas causing sleep  fragmentation and severe oxygen desaturation. Per Dr. Vassie Loll Note Cpap DID NOT prevent hypoxia during this admit.  P:   -cont auto BiPAP qhs -oxygen as needed to keep SpO2 88 to 92% -consider trach later if worse on bipap -DC home when bipap made available. CARDIOVASCULAR A: Acute on chronic cor pulmonale. Hx of hypertension. P:  -continue lasix, aldactone  RENAL  Recent Labs Lab 07/16/12 0655 07/17/12 0400 07/19/12 0350  K 5.0 4.3 4.4     Intake/Output Summary (Last 24 hours) at 07/20/12 1002 Last data filed at 07/20/12 0810  Gross per 24 hour  Intake   1920 ml  Output   1910 ml  Net     10 ml    A: Increased potassium, resolved P:   -monitor electrolytes closely while on aldactone  GASTROINTESTINAL A:  Morbid obesity with nl TSH this adm(0,813) P:   -CHO modified diet -nutrition to assess diet needs to  assist with weight loss -may need bariatric surgery evaluation as outpt  ENDOCRINE A: Mild hyperglycemia, diet controlled >> no prior documented hx of DM Elevated A1C (7.1)  CBG (last 3)  No results found for this basename: GLUCAP,  in the last 72 hours   P:   -started metformin 6/02(glucose 111 on 6-3 -diabetic teaching  NEUROLOGIC A:  Acute metabolic encephalopathy in the setting of hypercarbic respiratory failure >> variable tme 6-3 almost somnolent in chair off cpap Deconditioning. P:   -monitor mental status. 6-5 less somnolent.  -PT/OT evaluation  He reports wearing bipap during the night but has dental cary that is painful on PPV. Case manager called for follow up on finances.  [facility] to place bipap machine in home and he will need O2 at 3 l/m bleed in. DME order placed 6-5. Denied 6-6 but [facility] re contacted. He meets all criteria for OHS/Cor pulmonale. [facility] denied request for Bipap therefore will dc on cpap at 17 cm of pressure. Goal is to dc in 24 hours. Follow up with Dr. Vassie Loll June 26th @11 :15 am. Dr. Shana Chute is now his PCP.

## 2012-07-20 NOTE — Progress Notes (Signed)
Patient on chair alarm, but keeps unplugging alarm and getting up instead of calling nursing staff. Girlfriend at beside.  Both patient and girlfriend educated about getting up without calling nursing staff and the potential risk for falling. Will continue to closely monitor patient. Setzer, Don Broach

## 2012-07-20 NOTE — Progress Notes (Signed)
Patient is now back into the chair with the Bipap on. RN went over the importance of wearing the Bipap machine with him and patient stated he understood.

## 2012-07-20 NOTE — Progress Notes (Signed)
Patient took Bipap off about an hour ago when he got into bed. States his head hurts too bad at this time to wear it. He thinks it might be because of the air pushing into his mouth affecting his wisdom tooth. Patient thinks he might need to go see a dentist. RN encouraged patient to wear his Bipap machine. Patient stated he would put it back on once his headache goes away.

## 2012-07-20 NOTE — Telephone Encounter (Signed)
ATC Danyell-- No answer [id number]

## 2012-07-23 NOTE — Telephone Encounter (Signed)
I spoke with the pt girlfriend and she states nothing is needed at this time. Carron Curie, CMA

## 2012-08-09 ENCOUNTER — Encounter: Payer: Self-pay | Admitting: Pulmonary Disease

## 2012-08-09 ENCOUNTER — Ambulatory Visit (INDEPENDENT_AMBULATORY_CARE_PROVIDER_SITE_OTHER): Payer: Medicaid Other | Admitting: Pulmonary Disease

## 2012-08-09 VITALS — BP 128/76 | HR 83 | Temp 97.8°F | Ht 68.0 in | Wt >= 6400 oz

## 2012-08-09 DIAGNOSIS — E662 Morbid (severe) obesity with alveolar hypoventilation: Secondary | ICD-10-CM

## 2012-08-09 DIAGNOSIS — G4733 Obstructive sleep apnea (adult) (pediatric): Secondary | ICD-10-CM

## 2012-08-09 MED ORDER — FUROSEMIDE 40 MG PO TABS: ORAL_TABLET | ORAL | Status: DC

## 2012-08-09 NOTE — Assessment & Plan Note (Signed)
OK to take evening dose of lasix by 4pm - refill will be given Stay on oxygen for now

## 2012-08-09 NOTE — Assessment & Plan Note (Signed)
CPAP is very effective Usage of  At least  6h / night is recommended Bariatric surgery referral in future  Weight loss encouraged, compliance with goal of at least 4-6 hrs every night is the expectation. Advised against medications with sedative side effects Cautioned against driving when sleepy - understanding that sleepiness will vary on a day to day basis

## 2012-08-09 NOTE — Patient Instructions (Signed)
CPAP is very effective Usage of  At least  6h / night is recommended OK to take evening dose of lasix by 4pm - refill will be given Stay on oxygen for now

## 2012-08-09 NOTE — Progress Notes (Signed)
  Subjective:    Patient ID: Aaron Bishop, male    DOB: 1965/09/12, 47 y.o.   MRN: [id number]  HPI  47 y/o morbidly obese smoker, for FU of OSA/ OHS & cor pulmonale   He was hospitalized from 11/12-17/13 for progressive SOB since 3 weeks , leg swelling &  acute hypercarbic respiratory failure with ABG of 7.28/66/78. This improved to 7.39/52/69 on discharge on 2 L of oxygen  Pro BNP was low.  He was also treated as COPD flare with steroids, Oxygen supplementation, nebulizers and Azithromycin, with satisfactory clinical response. Bilateral leg edema was felt to be due to right heart failure, and responded satisfactorily to Lasix. 2D Echocardiogram revealed normal systolic dysfunction, EF 60-65%.  Was placed on BiPAP during hospital stay and he had some problems exhaling but feels that he could adjust given the time. Epworth sleepiness score is 12/ 24.     05/10/2012  Severe OSA on study - AHI 180/h with nadir desatn 56% ! This was corrected by cpap 17 cm to AHI 18/h  Order was sent to DME for autoCPAP 10-17 , med quattro mask, 2L o2 blended in - but due tot he 'bad weather' he has not obtained his cpap yet    08/09/2012 admitted 5/29 with fluid volume overload and hypercarbic respiratory failure secondary to decompensated OSA/OHS and acute on chronic cor pulmonale -had not obtained CPAP proBNP 1941 Echo >> mild LVH, EF 55 to 60%  7.34/72/52 DC'd on auto Cpap & 3L O2   Tolerating well - download on auto 10-17 6/6-6/25/14 shows avg pr 17 cm , leak ++, avg usage 5.5.h Pr ok, mask ok, no dryness or nasal symptoms Nocturia + takes lasix 8am & 6p satn 89% RA  Review of Systems neg for any significant sore throat, dysphagia, itching, sneezing, nasal congestion or excess/ purulent secretions, fever, chills, sweats, unintended wt loss, pleuritic or exertional cp, hempoptysis, orthopnea pnd or change in chronic leg swelling. Also denies presyncope, palpitations, heartburn, abdominal pain, nausea,  vomiting, diarrhea or change in bowel or urinary habits, dysuria,hematuria, rash, arthralgias, visual complaints, headache, numbness weakness or ataxia.     Objective:   Physical Exam  Gen. Pleasant, obese, in no distress, normal affect ENT - no lesions, no post nasal drip, class 2-3 airway Neck: No JVD, no thyromegaly, no carotid bruits Lungs: no use of accessory muscles, no dullness to percussion, decreased without rales or rhonchi  Cardiovascular: Rhythm regular, heart sounds  normal, no murmurs or gallops, no peripheral edema Abdomen: soft and non-tender, no hepatosplenomegaly, BS normal. Musculoskeletal: No deformities, no cyanosis or clubbing Neuro:  alert, non focal, no tremors       Assessment & Plan:

## 2012-08-10 ENCOUNTER — Telehealth: Payer: Self-pay | Admitting: Pulmonary Disease

## 2012-08-10 NOTE — Telephone Encounter (Signed)
I spoke with fiancee. She stated she called [facility] and spoke with them . They are coming out to change his O2 tank. She needed nothing from [place].

## 2012-08-20 ENCOUNTER — Ambulatory Visit: Payer: Self-pay | Admitting: Pulmonary Disease

## 2012-08-22 ENCOUNTER — Telehealth: Payer: Self-pay | Admitting: *Deleted

## 2012-08-22 MED ORDER — SPIRONOLACTONE 25 MG PO TABS: 25.0000 mg | ORAL_TABLET | Freq: Two times a day (BID) | ORAL | Status: DC

## 2012-08-22 NOTE — Telephone Encounter (Signed)
OK 

## 2012-08-22 NOTE — Telephone Encounter (Signed)
Received refill request for aldactone 50 mg tabs. The pt would like script for 25 mg to take  Tabs daily. It would be $8 instead of $23 for the 50 mg tablets. Please advise Dr. Vassie Loll if okay to send thanks

## 2012-08-22 NOTE — Telephone Encounter (Signed)
Refill sent.

## 2012-10-04 ENCOUNTER — Encounter: Payer: Self-pay | Admitting: Pulmonary Disease

## 2012-10-04 ENCOUNTER — Ambulatory Visit (INDEPENDENT_AMBULATORY_CARE_PROVIDER_SITE_OTHER): Payer: Medicaid Other | Admitting: Pulmonary Disease

## 2012-10-04 VITALS — BP 134/76 | HR 98 | Temp 98.1°F | Ht 69.0 in | Wt >= 6400 oz

## 2012-10-04 DIAGNOSIS — G4733 Obstructive sleep apnea (adult) (pediatric): Secondary | ICD-10-CM

## 2012-10-04 DIAGNOSIS — I2609 Other pulmonary embolism with acute cor pulmonale: Secondary | ICD-10-CM

## 2012-10-04 NOTE — Patient Instructions (Signed)
Keep using CPAP at least 6h every night OK to stop aldactone Blood work with dr Algie Coffer Stay on oxygen Congratulations on quitting smoking

## 2012-10-04 NOTE — Progress Notes (Signed)
  Subjective:    Patient ID: Aaron Bishop, male    DOB: 07/25/1965, 47 y.o.   MRN: [id number]  HPI  47 y/o morbidly obese smoker, for FU of OSA/ OHS & cor pulmonale  He was hospitalized from 11/12-17/13 for progressive SOB since 3 weeks , leg swelling & acute hypercarbic respiratory failure with ABG of 7.28/66/78. This improved to 7.39/52/69 on discharge on 2 L of oxygen Pro BNP was low.  He was also treated as COPD flare with steroids, Oxygen supplementation, nebulizers and Azithromycin, with satisfactory clinical response. Bilateral leg edema was felt to be due to right heart failure, and responded to Lasix. 2D Echocardiogram revealed normal systolic dysfunction, EF 60-65%.   PSG 03/2012 -Severe OSA - AHI 180/h with nadir desatn 56% ! This was corrected by cpap 17 cm to AHI 18/h   Admitted 07/12/12  with fluid volume overload and hypercarbic respiratory failure secondary to decompensated OSA/OHS and acute on chronic cor pulmonale -had not obtained CPAP  proBNP 1941  Echo >> mild LVH, EF 55 to 60%  7.34/72/52  DC'd on auto Cpap & 3L O2  Tolerating well - download on auto 10-17 6/6-6/25/14 shows avg pr 17 cm , leak ++, avg usage 5.5.h   10/04/2012 Aldactone refill sent -causes nausea Pt reports he wears his CPAP w/ 2 liters O2 everynight x 7-8 hrs a night. Denies any problems w/ mask/machine Pr ok, mask ok, no dryness or nasal symptoms  Nocturia + takes lasix 8am & 6p - not very comlpiant with second dose of lasix Quit 07/2012  -'Slipped' & started smoking again  Review of Systems neg for any significant sore throat, dysphagia, itching, sneezing, nasal congestion or excess/ purulent secretions, fever, chills, sweats, unintended wt loss, pleuritic or exertional cp, hempoptysis, orthopnea pnd or change in chronic leg swelling. Also denies presyncope, palpitations, heartburn, abdominal pain, nausea, vomiting, diarrhea or change in bowel or urinary habits, dysuria,hematuria, rash, arthralgias,  visual complaints, headache, numbness weakness or ataxia.     Objective:   Physical Exam  Gen. Pleasant, obese, in no distress ENT - no lesions, no post nasal drip Neck: No JVD, no thyromegaly, no carotid bruits Lungs: no use of accessory muscles, no dullness to percussion, decreased without rales or rhonchi  Cardiovascular: Rhythm regular, heart sounds  normal, no murmurs or gallops, no peripheral edema Musculoskeletal: No deformities, no cyanosis or clubbing , no tremors       Assessment & Plan:

## 2012-10-05 NOTE — Assessment & Plan Note (Signed)
Dc aldactone Asked him to be compliant with lasix bid dosing - can take pm dose early to avoid nocturia Ct 3L o2 - reassess next visit - he will have labs with dr Algie Coffer for K check

## 2012-10-05 NOTE — Assessment & Plan Note (Signed)
Ct cpap 17 cm  Weight loss encouraged, compliance with goal of at least 4-6 hrs every night is the expectation. Advised against medications with sedative side effects Cautioned against driving when sleepy - understanding that sleepiness will vary on a day to day basis  

## 2013-01-04 ENCOUNTER — Ambulatory Visit: Payer: Medicaid Other | Admitting: Adult Health

## 2013-01-07 ENCOUNTER — Encounter: Payer: Self-pay | Admitting: Adult Health

## 2013-01-07 ENCOUNTER — Ambulatory Visit (INDEPENDENT_AMBULATORY_CARE_PROVIDER_SITE_OTHER): Payer: Medicaid Other | Admitting: Adult Health

## 2013-01-07 VITALS — BP 112/68 | HR 92 | Temp 98.5°F | Ht 69.0 in | Wt >= 6400 oz

## 2013-01-07 DIAGNOSIS — G4733 Obstructive sleep apnea (adult) (pediatric): Secondary | ICD-10-CM

## 2013-01-07 DIAGNOSIS — I2609 Other pulmonary embolism with acute cor pulmonale: Secondary | ICD-10-CM

## 2013-01-07 NOTE — Patient Instructions (Signed)
Keep using CPAP at least 6h every night Wear Oxygen 2l/m continuous and At bedtime  With CPAP .  follow up Dr. Vassie Loll  In 4 months and As needed

## 2013-01-07 NOTE — Assessment & Plan Note (Signed)
Chronic cor pulmonale, controlled on present regimen. Plan No changes

## 2013-01-07 NOTE — Progress Notes (Signed)
  Subjective:    Patient ID: Aaron Bishop, male    DOB: Oct 26, 1965, 47 y.o.   MRN: [id number]  HPI   47 y/o morbidly obese smoker, for FU of OSA/ OHS & cor pulmonale  He was hospitalized from 11/12-17/13 for progressive SOB since 3 weeks , leg swelling & acute hypercarbic respiratory failure with ABG of 7.28/66/78. This improved to 7.39/52/69 on discharge on 2 L of oxygen Pro BNP was low.  He was also treated as COPD flare with steroids, Oxygen supplementation, nebulizers and Azithromycin, with satisfactory clinical response. Bilateral leg edema was felt to be due to right heart failure, and responded to Lasix. 2D Echocardiogram revealed normal systolic dysfunction, EF 60-65%.   PSG 03/2012 -Severe OSA - AHI 180/h with nadir desatn 56% ! This was corrected by cpap 17 cm to AHI 18/h   Admitted 07/12/12  with fluid volume overload and hypercarbic respiratory failure secondary to decompensated OSA/OHS and acute on chronic cor pulmonale -had not obtained CPAP  proBNP 1941  Echo >> mild LVH, EF 55 to 60%  7.34/72/52  DC'd on auto Cpap & 3L O2  Tolerating well - download on auto 10-17 6/6-6/25/14 shows avg pr 17 cm , leak ++, avg usage 5.5.h   10/04/12  Aldactone refill sent -causes nausea Pt reports he wears his CPAP w/ 2 liters O2 everynight x 7-8 hrs a night. Denies any problems w/ mask/machine Pr ok, mask ok, no dryness or nasal symptoms  Nocturia + takes lasix 8am & 6p - not very comlpiant with second dose of lasix Quit 07/2012  -'Slipped' & started smoking again >>d/c aldactone,   01/07/2013 Follow up OSA/OHS and Cor Pulmonale.  Patient returns for a three-month followup. He reports that he has been doing well. He is wearing his CPAP at 2 L of oxygen at least 7- hours a night. He denies any problems with his mask or machine. Last visit. His Aldactone was discontinued. He, says he's had no increase flare of lower extremities. Swelling. He has not restarted smoking. He was congratulated on  smoking cessation. Patient denies any hemoptysis, orthopnea, PND, or leg swelling.  Review of Systems  neg for any significant sore throat, dysphagia, itching, sneezing, nasal congestion or excess/ purulent secretions, fever, chills, sweats, unintended wt loss, pleuritic or exertional cp, hempoptysis, orthopnea pnd or change in chronic leg swelling. Also denies presyncope, palpitations, heartburn, abdominal pain, nausea, vomiting, diarrhea or change in bowel or urinary habits, dysuria,hematuria, rash, arthralgias, visual complaints, headache, numbness weakness or ataxia.     Objective:   Physical Exam   Gen. Pleasant, obese, in no distress ENT - no lesions, no post nasal drip Neck: No JVD, no thyromegaly, no carotid bruits Lungs: no use of accessory muscles, no dullness to percussion, decreased without rales or rhonchi  Cardiovascular: Rhythm regular, heart sounds  normal, no murmurs or gallops, no peripheral edema Musculoskeletal: No deformities, no cyanosis or clubbing , no tremors       Assessment & Plan:

## 2013-01-07 NOTE — Assessment & Plan Note (Signed)
Sleep apnea compensated on CPAP, and oxygen. Were obtained. Download. Patient encouraged on weight loss. Patient declined. Flu vaccine. Patient return in 4 months for follow with Dr. Vassie Loll

## 2013-03-25 ENCOUNTER — Other Ambulatory Visit: Payer: Self-pay | Admitting: Cardiovascular Disease

## 2013-03-25 ENCOUNTER — Ambulatory Visit
Admission: RE | Admit: 2013-03-25 | Discharge: 2013-03-25 | Disposition: A | Discharge status: Home / Self Care | Payer: Medicaid Other | Source: Ambulatory Visit | Attending: Cardiovascular Disease | Admitting: Cardiovascular Disease

## 2013-03-25 DIAGNOSIS — R52 Pain, unspecified: Secondary | ICD-10-CM

## 2013-03-28 ENCOUNTER — Other Ambulatory Visit: Payer: Self-pay | Admitting: Gastroenterology

## 2013-03-28 DIAGNOSIS — R109 Unspecified abdominal pain: Secondary | ICD-10-CM

## 2013-04-01 ENCOUNTER — Ambulatory Visit
Admission: RE | Admit: 2013-04-01 | Discharge: 2013-04-01 | Disposition: A | Discharge status: Home / Self Care | Payer: Medicaid Other | Source: Ambulatory Visit | Attending: Gastroenterology | Admitting: Gastroenterology

## 2013-04-01 DIAGNOSIS — R109 Unspecified abdominal pain: Secondary | ICD-10-CM

## 2013-04-01 MED ORDER — IOHEXOL 300 MG/ML  SOLN
150.0000 mL | Freq: Once | INTRAMUSCULAR | Status: AC | PRN
  Administered 2013-04-01: 150 mL via INTRAVENOUS

## 2013-04-16 ENCOUNTER — Encounter (INDEPENDENT_AMBULATORY_CARE_PROVIDER_SITE_OTHER): Payer: Self-pay

## 2013-04-16 ENCOUNTER — Encounter (INDEPENDENT_AMBULATORY_CARE_PROVIDER_SITE_OTHER): Payer: Self-pay | Admitting: General Surgery

## 2013-04-16 ENCOUNTER — Ambulatory Visit (INDEPENDENT_AMBULATORY_CARE_PROVIDER_SITE_OTHER): Payer: Medicaid Other | Admitting: General Surgery

## 2013-04-16 VITALS — BP 150/90 | HR 92 | Temp 98.2°F | Resp 15 | Ht 69.0 in | Wt >= 6400 oz

## 2013-04-16 DIAGNOSIS — K429 Umbilical hernia without obstruction or gangrene: Secondary | ICD-10-CM

## 2013-04-16 NOTE — Progress Notes (Signed)
Patient ID: Aaron Bishop, male   DOB: 07-Jun-1965, 49 y.o.   MRN: [id number]  Chief Complaint  Patient presents with  . New Evaluation    eval enlarge perumbilical hernia    HPI Aaron Bishop is a 48 y.o. male.  We are asked to see the patient in consultation by Dr. Algie Coffer to evaluate him for an umbilical hernia. The patient is a 76 her old black male who has noticed a swollen area at the upper edge of his umbilicus for the last couple years. He has significant discomfort associated with it and has had a couple episodes of severe pain. He has also had some nausea associated with it but no vomiting. He also has problems with constipation and periodically has to have a laxative to have a good bowel movement. He is a smoker and is dependent on oxygen at home but he uses it mostly at night. He also has a CPAP machine she is at night as well. He recently underwent a CT scan which did show an umbilical hernia involving mostly omentum But no bowel was seen within the hernia sac. 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 Hypertension   . Morbid obesity   . Asthma   . Hyperlipidemia   . Respiratory failure   . COPD (chronic obstructive pulmonary disease)     Past Surgical History  Procedure Laterality Date  . No past surgeries      Family History  Problem Relation Age of Onset  . Asthma Mother   . Allergies Mother   . Cancer Maternal Grandmother     Social History History  Substance Use Topics  . Smoking status: Current Every Day Smoker -- 0.20 packs/day for 30 years    Types: Cigarettes  . Smokeless tobacco: Never Used  . Alcohol Use: 0.6 oz/week    1 Cans of beer per week     Comment: daily    No Known Allergies  Current Outpatient Prescriptions  Medication Sig Dispense Refill  . albuterol (PROVENTIL) (5 MG/ML) 0.5% nebulizer solution Take 0.5 mLs (2.5 mg total) by nebulization  every 4 (four) hours as needed for wheezing.  20 mL  2  . furosemide (LASIX) 40 MG tablet TAKE ONE TABLET BY MOUTH TWICE DAILY  60 tablet  1  . loratadine (CLARITIN) 10 MG tablet Take 10 mg by mouth daily.      . metFORMIN (GLUCOPHAGE) 500 MG tablet Take 1 tablet (500 mg total) by mouth daily with breakfast.       No current facility-administered medications for this visit.    Review of Systems Review of Systems  Constitutional: Negative.   HENT: Negative.   Eyes: Negative.   Respiratory: Negative.   Cardiovascular: Negative.   Gastrointestinal: Positive for nausea and abdominal pain.  Endocrine: Negative.   Genitourinary: Negative.   Musculoskeletal: Negative.   Skin: Negative.   Allergic/Immunologic: Negative.   Neurological: Negative.   Hematological: Negative.   Psychiatric/Behavioral: Negative.     Blood pressure 150/90, pulse 92, temperature 98.2 F (36.8 C), temperature source Oral, resp. rate 15, height 5\' 9"  (1.753 m), weight 445 lb 3.2 oz (201.941 kg).  Physical Exam Physical Exam  Constitutional: He is oriented to person, place, and time.  The patient is morbidly obese  HENT:  Head: Normocephalic and atraumatic.  Eyes: Conjunctivae and EOM are normal. Pupils are equal,  round, and reactive to light.  Neck: Normal range of motion. Neck supple.  Cardiovascular: Normal rate, regular rhythm and normal heart sounds.   Pulmonary/Chest: Effort normal and breath sounds normal.  Abdominal: Soft. Bowel sounds are normal.  There is a reducible bulge just at the upper edge of the umbilicus. It is painful to palpation. There is no evidence of obstruction. The patient is morbidly obese  Musculoskeletal: Normal range of motion.  Neurological: He is alert and oriented to person, place, and time.  Skin: Skin is warm and dry.  Psychiatric: He has a normal mood and affect. His behavior is normal.    Data Reviewed As above  Assessment    The patient has a moderate-sized  umbilical hernia involving omentum. The patient also has significant obstructive sleep apnea and COPD from smoking and morbid obesity. I've talked to him in detail about the risks of his hernia gradually getting larger as well as the risk of incarceration and strangulation. I've also talked to him in detail about the surgery to repair the hernia including the risks and benefits of surgery as well as some of the technical aspects. I think at this point he is high risk for surgery given his comorbidities. But given his pain there certainly the possibility that we may have to do his surgery sooner rather than later. I have explained them that I think there is significant risk that he may end up on a ventilator for a period of time. There is also a risk that his repair could fall apart given his size.     Plan    At this point I would recommend getting clearance from his medical doctor as well as his pulmonologist for surgery. I think he may also benefit from consultation with the bariatric surgeons to see if he would be a candidate for some sort of bariatric surgery to help him lose weight. Certainly I think the more weight he could lose by the time we have to operate on his hernia the better off he would be.  We will start this process and see him back in the next few weeks to go over the opinions of his pulmonary doctors and then proceed accordingly.       TOTH III,PAUL S 04/16/2013, 10:54 AM

## 2013-04-16 NOTE — Patient Instructions (Signed)
Will get clearance from pulmonology and medical doc Will start with bariatric seminar

## 2013-04-17 ENCOUNTER — Telehealth: Payer: Self-pay | Admitting: Pulmonary Disease

## 2013-04-17 NOTE — Telephone Encounter (Signed)
He is high risk for GA due to super obesity & obesity hypoventilation

## 2013-04-26 ENCOUNTER — Telehealth (INDEPENDENT_AMBULATORY_CARE_PROVIDER_SITE_OTHER): Payer: Self-pay

## 2013-04-26 NOTE — Telephone Encounter (Signed)
Pulmonary says he is high risk. i guess that means no operation for now until he loses some weight

## 2013-04-26 NOTE — Telephone Encounter (Signed)
LM with family member to have pt call back. Below msg saysPulmonary has not cleared pt for surgery. Please call our office back to schedule once cleared.

## 2013-04-26 NOTE — Telephone Encounter (Signed)
Pt calling saying he is returning our call.  Dr. Carolynne Edouardoth is waiting for clearance from pt's pulmonologist.  I advised the patient to call them to schedule an appointment. He agreed.

## 2013-05-09 ENCOUNTER — Ambulatory Visit (INDEPENDENT_AMBULATORY_CARE_PROVIDER_SITE_OTHER): Payer: Medicaid Other | Admitting: Pulmonary Disease

## 2013-05-09 ENCOUNTER — Encounter: Payer: Self-pay | Admitting: Pulmonary Disease

## 2013-05-09 VITALS — BP 142/80 | HR 86 | Temp 98.1°F | Ht 69.0 in | Wt >= 6400 oz

## 2013-05-09 DIAGNOSIS — I2609 Other pulmonary embolism with acute cor pulmonale: Secondary | ICD-10-CM

## 2013-05-09 DIAGNOSIS — G4733 Obstructive sleep apnea (adult) (pediatric): Secondary | ICD-10-CM

## 2013-05-09 NOTE — Progress Notes (Signed)
   Subjective:    Patient ID: Aaron Bishop, male    DOB: 01/14/1966, 48 y.o.   MRN: [id number]  HPI  48 y/o morbidly obese smoker, for FU of OSA/ OHS & cor pulmonale  He was hospitalized from 11/12-17/13 for progressive SOB since 3 weeks , leg swelling & acute hypercarbic respiratory failure with ABG of 7.28/66/78. This improved to 7.39/52/69 on discharge on 2 L of oxygen Pro BNP was low.  He was also treated as COPD flare with steroids, Oxygen supplementation, nebulizers and Azithromycin, with satisfactory clinical response. Bilateral leg edema was felt to be due to right heart failure, and responded to Lasix. 2D Echocardiogram revealed normal systolic dysfunction, EF 60-65%.  PSG 03/2012 -Severe OSA - AHI 180/h with nadir desatn 56% ! This was corrected by cpap 17 cm to AHI 18/h  Admitted 07/12/12 with fluid volume overload and hypercarbic respiratory failure secondary to decompensated OSA/OHS and acute on chronic cor pulmonale -had not obtained CPAP  proBNP 1941  Echo >> mild LVH, EF 55 to 60%  7.34/72/52  DC'd on auto Cpap & 3L O2  download on auto 10-17 6/6-6/25/14 shows avg pr 17 cm , leak ++, avg usage 5.5.h     05/09/2013  5932m FU Follow up OSA/OHS and Cor Pulmonale.   He is high risk for GA / umbilical hernia surgery Quit 07/2012 - He has restarted smoking. Smokes 2 cigs/d C/o nasal stuffiness   He is wearing his CPAP at 2 L of oxygen at least 7- hours a night. He denies any problems with his mask or machine.   Dropped lasix to once daily satn 92% on arrival Was turned down for bariatric surgery Patient denies any hemoptysis, orthopnea, PND, or leg swelling.    Review of Systems neg for any significant sore throat, dysphagia, itching, sneezing, nasal congestion or excess/ purulent secretions, fever, chills, sweats, unintended wt loss, pleuritic or exertional cp, hempoptysis, orthopnea pnd or change in chronic leg swelling. Also denies presyncope, palpitations, heartburn,  abdominal pain, nausea, vomiting, diarrhea or change in bowel or urinary habits, dysuria,hematuria, rash, arthralgias, visual complaints, headache, numbness weakness or ataxia.     Objective:   Physical Exam  Gen. Pleasant, obese, in no distress ENT - swollen turbinates, no post nasal drip Neck: No JVD, no thyromegaly, no carotid bruits Lungs: no use of accessory muscles, no dullness to percussion, decreased without rales or rhonchi  Cardiovascular: Rhythm regular, heart sounds  normal, no murmurs or gallops, 1+ peripheral edema Musculoskeletal: No deformities, no cyanosis or clubbing , no tremors       Assessment & Plan:

## 2013-05-09 NOTE — Assessment & Plan Note (Signed)
Stay on CPAP with oxygen 2L We will check your oxygen levels on this Trial of nasal spray -each nare at bedtime   Weight loss encouraged, compliance with goal of at least 4-6 hrs every night is the expectation. Advised against medications with sedative side effects Cautioned against driving when sleepy - understanding that sleepiness will vary on a day to day basis

## 2013-05-09 NOTE — Patient Instructions (Signed)
Stay on CPAP with oxygen 2L We will check your oxygen levels on this Trial of nasal spray -each nare at bedtime You have to QUIT smoking Increase lasix to twice daily x 5 days when you see fluid build up in legs or if breathing worse

## 2013-05-09 NOTE — Assessment & Plan Note (Signed)
You have to QUIT smoking Increase lasix to twice daily x 5 days when you see fluid build up in legs or if breathing worse

## 2013-05-20 ENCOUNTER — Encounter (INDEPENDENT_AMBULATORY_CARE_PROVIDER_SITE_OTHER): Payer: Self-pay | Admitting: General Surgery

## 2013-05-20 ENCOUNTER — Ambulatory Visit (INDEPENDENT_AMBULATORY_CARE_PROVIDER_SITE_OTHER): Payer: Medicaid Other | Admitting: General Surgery

## 2013-05-20 VITALS — BP 128/86 | HR 80 | Temp 97.2°F | Resp 16 | Ht 69.0 in | Wt >= 6400 oz

## 2013-05-20 DIAGNOSIS — K429 Umbilical hernia without obstruction or gangrene: Secondary | ICD-10-CM

## 2013-05-20 NOTE — Progress Notes (Signed)
Subjective:     Patient ID: Aaron Bishop, male   DOB: 04/12/1965, 48 y.o.   MRN: [id number]  HPI The patient is a 48 year old black male who is super morbidly obese with an umbilical hernia. Since his last visit we had him evaluated by the pulmonologist and they feel like he is at significantly high risk for any kind of surgical procedure at the moment. They have been counseling him on smoking cessation and weight loss. At the moment he has no abdominal complaints at all.  Review of Systems  Constitutional: Negative.   HENT: Negative.   Eyes: Negative.   Respiratory: Positive for apnea and shortness of breath.   Cardiovascular: Negative.   Gastrointestinal: Negative.   Endocrine: Negative.   Genitourinary: Negative.   Musculoskeletal: Negative.   Skin: Negative.   Allergic/Immunologic: Negative.   Neurological: Negative.   Hematological: Negative.   Psychiatric/Behavioral: Negative.        Objective:   Physical Exam  Constitutional: He is oriented to person, place, and time. He appears well-developed and well-nourished.  He is super morbidly obese  HENT:  Head: Normocephalic and atraumatic.  Eyes: Conjunctivae and EOM are normal. Pupils are equal, round, and reactive to light.  Neck: Normal range of motion. Neck supple.  Cardiovascular: Normal rate, regular rhythm and normal heart sounds.   Pulmonary/Chest: Effort normal and breath sounds normal.  Abdominal: Soft. Bowel sounds are normal.  There is a bulge just above the umbilicus that is nontender.  Musculoskeletal: Normal range of motion.  Neurological: He is alert and oriented to person, place, and time.  Skin: Skin is warm and dry.  Psychiatric: He has a normal mood and affect. His behavior is normal.       Assessment:     The patient has an umbilical hernia. His situation is complicated by being super morbidly obese with obstructive sleep apnea and he continues to smoke.     Plan:     At this point the  pulmonologist feels like he is too high of a risk for surgery. He will continue to work on smoking cessation and weight loss. We will plan to see him back in about 6 months to track his progress

## 2013-05-20 NOTE — Patient Instructions (Signed)
Continue to work on weight loss and stop smoking

## 2013-05-23 ENCOUNTER — Telehealth: Payer: Self-pay | Admitting: Pulmonary Disease

## 2013-05-23 NOTE — Telephone Encounter (Signed)
I spoke with patient about results and he verbalized understanding and had no questions 

## 2013-05-23 NOTE — Telephone Encounter (Signed)
ONO on CPAP + 2L O2 >> 80 min desatn Stay on O2

## 2013-07-15 ENCOUNTER — Telehealth: Payer: Self-pay | Admitting: *Deleted

## 2013-07-15 NOTE — Telephone Encounter (Signed)
Severe desat on RA Stay on O2 and CPAP

## 2013-07-15 NOTE — Telephone Encounter (Signed)
lmomtcb x1 for pt 

## 2013-07-16 NOTE — Telephone Encounter (Signed)
lmomtcb x 2  

## 2013-07-17 NOTE — Telephone Encounter (Signed)
lmomtcb x 3  

## 2013-07-18 NOTE — Telephone Encounter (Signed)
LMTCB x 4 on both home and cell #. Carron Curie, CMA

## 2013-07-23 ENCOUNTER — Encounter: Payer: Self-pay | Admitting: *Deleted

## 2013-07-23 NOTE — Telephone Encounter (Signed)
lmomtcb x5. Will send letter. Will also forward to RA as an [place]

## 2013-08-13 ENCOUNTER — Encounter: Payer: Self-pay | Admitting: Pulmonary Disease

## 2013-11-08 ENCOUNTER — Encounter: Payer: Self-pay | Admitting: Adult Health

## 2013-11-08 ENCOUNTER — Ambulatory Visit (INDEPENDENT_AMBULATORY_CARE_PROVIDER_SITE_OTHER): Payer: Medicaid Other | Admitting: Adult Health

## 2013-11-08 ENCOUNTER — Ambulatory Visit (INDEPENDENT_AMBULATORY_CARE_PROVIDER_SITE_OTHER)
Admission: RE | Admit: 2013-11-08 | Discharge: 2013-11-08 | Disposition: A | Discharge status: Home / Self Care | Payer: Medicaid Other | Source: Ambulatory Visit | Attending: Adult Health | Admitting: Adult Health

## 2013-11-08 VITALS — BP 116/82 | HR 67 | Temp 97.5°F | Ht 71.0 in | Wt >= 6400 oz

## 2013-11-08 DIAGNOSIS — J9611 Chronic respiratory failure with hypoxia: Secondary | ICD-10-CM

## 2013-11-08 DIAGNOSIS — I2609 Other pulmonary embolism with acute cor pulmonale: Secondary | ICD-10-CM

## 2013-11-08 DIAGNOSIS — G4733 Obstructive sleep apnea (adult) (pediatric): Secondary | ICD-10-CM

## 2013-11-08 DIAGNOSIS — R0902 Hypoxemia: Secondary | ICD-10-CM

## 2013-11-08 DIAGNOSIS — E662 Morbid (severe) obesity with alveolar hypoventilation: Secondary | ICD-10-CM

## 2013-11-08 DIAGNOSIS — J9610 Chronic respiratory failure, unspecified whether with hypoxia or hypercapnia: Secondary | ICD-10-CM

## 2013-11-08 NOTE — Assessment & Plan Note (Signed)
Keep using CPAP At bedtime  With Oxygen at 2l/m  Follow up Dr. Vassie Loll  In 4 months with PFT and As needed

## 2013-11-08 NOTE — Assessment & Plan Note (Signed)
Compensated on CPAP w/ good reported compliance  Download requested.   Plan  Keep using CPAP At bedtime  With Oxygen at 2l/m  Work on weight los.  Must quit smoking .  Follow up Dr. Vassie Loll  In 4 months with PFT and As needed

## 2013-11-08 NOTE — Assessment & Plan Note (Signed)
Chronic Resp Failure with OSA/OHS in pt with morbid obesity and ongoing smoking  Will follow up cxr today  Need PFT on return  Declines flu shot

## 2013-11-08 NOTE — Progress Notes (Signed)
Reviewed & agree with plan  

## 2013-11-08 NOTE — Progress Notes (Signed)
   Subjective:    Patient ID: Aaron Bishop, male    DOB: 1965-04-05, 48 y.o.   MRN: [id number]  HPI   48 y/o morbidly obese smoker, for FU of OSA/ OHS & cor pulmonale  He was hospitalized from 11/12-17/13 for progressive SOB since 3 weeks , leg swelling & acute hypercarbic respiratory failure with ABG of 7.28/66/78. This improved to 7.39/52/69 on discharge on 2 L of oxygen Pro BNP was low.  He was also treated as COPD flare with steroids, Oxygen supplementation, nebulizers and Azithromycin, with satisfactory clinical response. Bilateral leg edema was felt to be due to right heart failure, and responded to Lasix. 2D Echocardiogram revealed normal systolic dysfunction, EF 60-65%.  PSG 03/2012 -Severe OSA - AHI 180/h with nadir desatn 56% ! This was corrected by cpap 17 cm to AHI 18/h  Admitted 07/12/12 with fluid volume overload and hypercarbic respiratory failure secondary to decompensated OSA/OHS and acute on chronic cor pulmonale -had not obtained CPAP  proBNP 1941  Echo >> mild LVH, EF 55 to 60%  7.34/72/52  DC'd on auto Cpap & 3L O2  download on auto 10-17 6/6-6/25/14 shows avg pr 17 cm , leak ++, avg usage 5.5.h     05/09/13  59m FU Follow up OSA/OHS and Cor Pulmonale.   He is high risk for GA / umbilical hernia surgery Quit 07/2012 - He has restarted smoking. Smokes 2 cigs/d C/o nasal stuffiness   He is wearing his CPAP at 2 L of oxygen at least 7- hours a night. He denies any problems with his mask or machine.   Dropped lasix to once daily satn 92% on arrival Was turned down for bariatric surgery Patient denies any hemoptysis, orthopnea, PND, or leg swelling.    11/08/2013 6 month follow up OSA/OHS and Cor Pulmonale  Patient returns for a six-month followup for sleep apnea. He says that he is wearing his CPAP with 2 L of oxygen, at least 7-8  hours each night. He denies any excessive daytime sleepiness. He's been advised not to drive. If sleepy.(he does not drive)   Unfortunately, patient's weight has trended back up. Patient advised on diet and weight loss. Patient denies any chest pain, orthopnea, PND, or increased leg swelling Patient has restarted smoking is advised on smoking cessation Declines flu shot today. 07/2013 Had ONO on RA , + desats , continue on O2 with CPAP .  Says he has been told he had COPD in past, Uses albuterol Neb on average 2 times a week. Has occasional cough and wheezing.      Review of Systems  neg for any significant sore throat, dysphagia, itching, sneezing, nasal congestion or excess/ purulent secretions, fever, chills, sweats, unintended wt loss, pleuritic or exertional cp, hempoptysis, orthopnea pnd or change in chronic leg swelling. Also denies presyncope, palpitations, heartburn, abdominal pain, nausea, vomiting, diarrhea or change in bowel or urinary habits, dysuria,hematuria, rash, arthralgias, visual complaints, headache, numbness weakness or ataxia.     Objective:   Physical Exam   Gen. Pleasant, obese, in no distress ENT - , no post nasal drip Neck: No JVD, no thyromegaly, no carotid bruits Lungs: no use of accessory muscles, no dullness to percussion, decreased without rales or rhonchi  Cardiovascular: Rhythm regular, heart sounds  normal, no murmurs or gallops, tr+ peripheral edema Musculoskeletal: No deformities, no cyanosis or clubbing , no tremors       Assessment & Plan:

## 2013-11-08 NOTE — Patient Instructions (Addendum)
Keep using CPAP At bedtime  With Oxygen at 2l/m  Wear Oxygen 2l/m with walking  Chest xray today  Work on [employer].  Must quit smoking .  Follow up Dr. Vassie Loll  In 4 months with PFT and As needed

## 2013-11-14 NOTE — Progress Notes (Signed)
Quick Note:  Called spoke with patient, advised of cxr results / recs as stated by TP. Pt verbalized his understanding and denied any questions. ______ 

## 2014-03-18 ENCOUNTER — Other Ambulatory Visit: Payer: Self-pay | Admitting: Pulmonary Disease

## 2014-03-18 DIAGNOSIS — J9620 Acute and chronic respiratory failure, unspecified whether with hypoxia or hypercapnia: Secondary | ICD-10-CM

## 2014-03-19 ENCOUNTER — Ambulatory Visit (INDEPENDENT_AMBULATORY_CARE_PROVIDER_SITE_OTHER): Payer: Medicaid Other | Admitting: Pulmonary Disease

## 2014-03-19 ENCOUNTER — Encounter: Payer: Self-pay | Admitting: Pulmonary Disease

## 2014-03-19 ENCOUNTER — Ambulatory Visit (HOSPITAL_COMMUNITY)
Admission: RE | Admit: 2014-03-19 | Discharge: 2014-03-19 | Disposition: A | Discharge status: Home / Self Care | Payer: Medicaid Other | Source: Ambulatory Visit | Attending: Pulmonary Disease | Admitting: Pulmonary Disease

## 2014-03-19 VITALS — BP 118/72 | HR 91 | Ht 69.0 in | Wt >= 6400 oz

## 2014-03-19 DIAGNOSIS — J9620 Acute and chronic respiratory failure, unspecified whether with hypoxia or hypercapnia: Secondary | ICD-10-CM

## 2014-03-19 DIAGNOSIS — G4733 Obstructive sleep apnea (adult) (pediatric): Secondary | ICD-10-CM

## 2014-03-19 LAB — PULMONARY FUNCTION TEST
DL/VA % pred: 122 %
DL/VA: 5.56 ml/min/mmHg/L
DLCO UNC % PRED: 74 %
DLCO unc: 22.08 ml/min/mmHg
FEF 25-75 Post: 4.84 L/sec
FEF 25-75 Pre: 3.36 L/sec
FEF2575-%Change-Post: 43 %
FEF2575-%Pred-Post: 148 %
FEF2575-%Pred-Pre: 103 %
FEV1-%Change-Post: 7 %
FEV1-%PRED-PRE: 75 %
FEV1-%Pred-Post: 81 %
FEV1-POST: 2.61 L
FEV1-Pre: 2.41 L
FEV1FVC-%Change-Post: 4 %
FEV1FVC-%PRED-PRE: 108 %
FEV6-%Change-Post: 3 %
FEV6-%Pred-Post: 73 %
FEV6-%Pred-Pre: 70 %
FEV6-POST: 2.81 L
FEV6-Pre: 2.73 L
FEV6FVC-%Change-Post: 0 %
FEV6FVC-%PRED-POST: 103 %
FEV6FVC-%Pred-Pre: 102 %
FVC-%Change-Post: 2 %
FVC-%PRED-POST: 71 %
FVC-%PRED-PRE: 69 %
FVC-Post: 2.81 L
FVC-Pre: 2.74 L
POST FEV1/FVC RATIO: 93 %
PRE FEV1/FVC RATIO: 88 %
Post FEV6/FVC ratio: 100 %
Pre FEV6/FVC Ratio: 100 %
RV % pred: 76 %
RV: 1.43 L
TLC % pred: 65 %
TLC: 4.29 L

## 2014-03-19 MED ORDER — ALBUTEROL SULFATE (2.5 MG/3ML) 0.083% IN NEBU
2.5000 mg | INHALATION_SOLUTION | Freq: Once | RESPIRATORY_TRACT | Status: AC
  Administered 2014-03-19: 2.5 mg via RESPIRATORY_TRACT

## 2014-03-19 NOTE — Patient Instructions (Addendum)
Stay on CPAP & oxygen during sleep Breathing test shows the effect of smoking & weight on your lungs Try to QUIT smoking altogether We can give you a letter for [facility]

## 2014-03-19 NOTE — Progress Notes (Signed)
   Subjective:    Patient ID: Aaron Bishop, male    DOB: 12/06/1965, 49 y.o.   MRN: [id number]  HPI  49 y/o morbidly obese smoker, for FU of OSA/ OHS & cor pulmonale  He was hospitalized from 12/2011 for progressive SOB since 3 weeks , leg swelling & acute hypercarbic respiratory failure with ABG of 7.28/66/78. This improved to 7.39/52/69 on discharge on 2 L of oxygen 2D Echocardiogram revealed normal systolic dysfunction, EF 60-65%.  PSG 03/2012 -Severe OSA - AHI 180/h with nadir desatn 56% ! This was corrected by cpap 17 cm to AHI 18/h  Admitted 06/2012 with fluid volume overload and hypercarbic respiratory failure secondary to decompensated OSA/OHS and acute on chronic cor pulmonale -had not obtained CPAP  Echo >> mild LVH, EF 55 to 60%  7.34/72/52  DC'd on auto Cpap & 3L O2  download on auto 10-17 6/6-6/25/14 shows avg pr 17 cm , leak ++, avg usage 5.5.h  07/2013 Had ONO on RA , + desats , continue on O2 with CPAP  03/19/2014  Chief Complaint  Patient presents with  . Follow-up    wearing CPAP every night, approx 6-8 hours nightly.  Pt has not had downloaded.  when he takes the mask off in the mornings, he feels like he is not getting enough air. ; PFT test today.    He says that he is wearing his CPAP with 2 L of oxygen, at least 7-8 hours each night. He denies any excessive daytime sleepiness- he does not drive Declines flu shot today. Fredna Dow.  Says he has been told he had COPD in past, Uses albuterol Neb on average 2-3  times a week. Has occasional cough and wheezing.  Quit 07/2012 - He has restarted smoking. Smokes 2 cigs/d PFTs- show moderate restriction related to obesity, no airway obstruction but some reversibility in the smaller airways  Review of Systems neg for any significant sore throat, dysphagia, itching, sneezing, nasal congestion or excess/ purulent secretions, fever, chills, sweats, unintended wt loss, pleuritic or exertional cp, hempoptysis, orthopnea pnd or  change in chronic leg swelling. Also denies presyncope, palpitations, heartburn, abdominal pain, nausea, vomiting, diarrhea or change in bowel or urinary habits, dysuria,hematuria, rash, arthralgias, visual complaints, headache, numbness weakness or ataxia.     Objective:   Physical Exam  Gen. Pleasant, obese, in no distress ENT - no lesions, no post nasal drip Neck: No JVD, no thyromegaly, no carotid bruits Lungs: no use of accessory muscles, no dullness to percussion, decreased without rales or rhonchi  Cardiovascular: Rhythm regular, heart sounds  normal, no murmurs or gallops, no peripheral edema Musculoskeletal: No deformities, no cyanosis or clubbing , no tremors       Assessment & Plan:

## 2014-03-21 NOTE — Assessment & Plan Note (Signed)
Stay on CPAP & oxygen during sleep Breathing test shows the effect of smoking & weight on your lungs Try to QUIT smoking altogether We can give you a letter for the [facility]  Weight loss encouraged, compliance with goal of at least 4-6 hrs every night is the expectation. Advised against medications with sedative side effects Cautioned against driving when sleepy - understanding that sleepiness will vary on a day to day basis

## 2014-07-01 ENCOUNTER — Encounter (HOSPITAL_COMMUNITY): Payer: Self-pay

## 2014-07-01 ENCOUNTER — Emergency Department (HOSPITAL_COMMUNITY)
Admission: EM | Admit: 2014-07-01 | Discharge: 2014-07-01 | Disposition: A | Discharge status: Home / Self Care | Payer: Medicaid Other | Attending: Emergency Medicine | Admitting: Emergency Medicine

## 2014-07-01 ENCOUNTER — Emergency Department (HOSPITAL_COMMUNITY): Payer: Medicaid Other

## 2014-07-01 DIAGNOSIS — Z720 Tobacco use: Secondary | ICD-10-CM | POA: Insufficient documentation

## 2014-07-01 DIAGNOSIS — J449 Chronic obstructive pulmonary disease, unspecified: Secondary | ICD-10-CM | POA: Diagnosis not present

## 2014-07-01 DIAGNOSIS — M25562 Pain in left knee: Secondary | ICD-10-CM | POA: Diagnosis not present

## 2014-07-01 DIAGNOSIS — I10 Essential (primary) hypertension: Secondary | ICD-10-CM | POA: Insufficient documentation

## 2014-07-01 DIAGNOSIS — M2550 Pain in unspecified joint: Secondary | ICD-10-CM

## 2014-07-01 DIAGNOSIS — Z79899 Other long term (current) drug therapy: Secondary | ICD-10-CM | POA: Diagnosis not present

## 2014-07-01 LAB — CBG MONITORING, ED: GLUCOSE-CAPILLARY: 121 mg/dL — AB (ref 65–99)

## 2014-07-01 MED ORDER — HYDROCODONE-ACETAMINOPHEN 5-325 MG PO TABS: 2.0000 | ORAL_TABLET | ORAL | Status: DC | PRN

## 2014-07-01 MED ORDER — PREDNISONE 20 MG PO TABS: 40.0000 mg | ORAL_TABLET | Freq: Every day | ORAL | Status: DC

## 2014-07-01 MED ORDER — HYDROCODONE-ACETAMINOPHEN 5-325 MG PO TABS
2.0000 | ORAL_TABLET | Freq: Once | ORAL | Status: AC
  Administered 2014-07-01: 2 via ORAL
  Filled 2014-07-01: qty 2

## 2014-07-01 NOTE — ED Notes (Signed)
Pt with left knee pain x 2 days.  No new injury noted.

## 2014-07-01 NOTE — Discharge Instructions (Signed)

## 2014-07-01 NOTE — ED Provider Notes (Signed)
CSN: [id number]     Arrival date & time 07/01/14  [id number] History   First MD Initiated Contact with Patient 07/01/14 [phone]     Chief Complaint  Patient presents with  . Knee Pain     (Consider location/radiation/quality/duration/timing/severity/associated sxs/prior Treatment) HPI Comments: Patient presents to the ER for evaluation of left knee pain. Symptoms have been present for 2 days. Patient denies injury. Patient reports moderate to severe and constant pain in the left knee that worsens with trying to bear weight or moving the joint. He has not had any fever. There is no redness, swelling overlying the joint.  Patient is a 49 y.o. male presenting with knee pain.  Knee Pain   Past Medical History  Diagnosis Date  . Hypertension   . Morbid obesity   . Asthma   . Hyperlipidemia   . Respiratory failure   . COPD (chronic obstructive pulmonary disease)    Past Surgical History  Procedure Laterality Date  . No past surgeries     Family History  Problem Relation Age of Onset  . Asthma Mother   . Allergies Mother   . Cancer Maternal Grandmother    History  Substance Use Topics  . Smoking status: Current Every Day Smoker -- 0.20 packs/day for 30 years    Types: Cigarettes  . Smokeless tobacco: Never Used  . Alcohol Use: 0.6 oz/week    1 Cans of beer per week     Comment: daily    Review of Systems  Musculoskeletal: Positive for arthralgias.  All other systems reviewed and are negative.     Allergies  Review of patient's allergies indicates no known allergies.  Home Medications   Prior to Admission medications   Medication Sig Start Date End Date Taking? Authorizing Provider  acetaminophen (TYLENOL) 325 MG tablet Take 650 mg by mouth every 6 (six) hours as needed for moderate pain.   Yes Historical Provider, MD  albuterol (PROVENTIL) (2.5 MG/3ML) 0.083% nebulizer solution Take 2.5 mg by nebulization 4 (four) times daily.   Yes Historical Provider, MD   amLODipine-olmesartan (AZOR) 10-40 MG per tablet Take 1 tablet by mouth daily.   Yes Historical Provider, MD  furosemide (LASIX) 40 MG tablet Take 40 mg by mouth daily. 08/09/12  Yes Oretha Milchakesh Alva V, MD  loratadine (CLARITIN) 10 MG tablet Take 10 mg by mouth daily.   Yes Historical Provider, MD  metFORMIN (GLUCOPHAGE) 500 MG tablet Take 1 tablet (500 mg total) by mouth daily with breakfast. 07/19/12  Yes Nyoka CowdenMichael B Wert, MD  albuterol (PROVENTIL) (5 MG/ML) 0.5% nebulizer solution Take 0.5 mLs (2.5 mg total) by nebulization every 4 (four) hours as needed for wheezing. Patient not taking: Reported on 07/01/2014 01/01/12   Laveda Normanhris N Oti, MD  HYDROcodone-acetaminophen (NORCO/VICODIN) 5-325 MG per tablet Take 2 tablets by mouth every 4 (four) hours as needed for moderate pain. 07/01/14   Gilda Creasehristopher J Pollina, MD  predniSONE (DELTASONE) 20 MG tablet Take 2 tablets (40 mg total) by mouth daily with breakfast. 07/01/14   Gilda Creasehristopher J Pollina, MD   BP 131/63 mmHg  Pulse 80  Temp(Src) 97.9 F (36.6 C) (Oral)  Resp 20  SpO2 96% Physical Exam  Constitutional: He is oriented to person, place, and time. He appears well-developed and well-nourished. No distress.  HENT:  Head: Normocephalic and atraumatic.  Right Ear: Hearing normal.  Left Ear: Hearing normal.  Nose: Nose normal.  Mouth/Throat: Oropharynx is clear and moist and mucous membranes are normal.  Eyes:  Conjunctivae and EOM are normal. Pupils are equal, round, and reactive to light.  Neck: Normal range of motion. Neck supple.  Cardiovascular: Regular rhythm, S1 normal and S2 normal.  Exam reveals no gallop and no friction rub.   No murmur heard. Pulmonary/Chest: Effort normal and breath sounds normal. No respiratory distress. He exhibits no tenderness.  Abdominal: Soft. Normal appearance and bowel sounds are normal. There is no hepatosplenomegaly. There is no tenderness. There is no rebound, no guarding, no tenderness at McBurney's point and negative  Murphy's sign. No hernia.  Musculoskeletal: Normal range of motion.       Left knee: He exhibits no swelling, no effusion, no ecchymosis, no deformity and no erythema. Tenderness found. Lateral joint line tenderness noted.  Neurological: He is alert and oriented to person, place, and time. He has normal strength. No cranial nerve deficit or sensory deficit. Coordination normal. GCS eye subscore is 4. GCS verbal subscore is 5. GCS motor subscore is 6.  Skin: Skin is warm, dry and intact. No rash noted. No cyanosis.  Psychiatric: He has a normal mood and affect. His speech is normal and behavior is normal. Thought content normal.  Nursing note and vitals reviewed.   ED Course  Procedures (including critical care time) Labs Review Labs Reviewed  CBG MONITORING, ED - Abnormal; Notable for the following:    Glucose-Capillary 121 (*)    All other components within normal limits    Imaging Review Dg Knee Complete 4 Views Left  07/01/2014   CLINICAL DATA:  Left knee pain worsening for 2 days  EXAM: LEFT KNEE - COMPLETE 4+ VIEW  COMPARISON:  None.  FINDINGS: Six views of the left knee submitted. There is narrowing of medial joint compartment. Mild spurring of medial tibial plateau. Mild spurring of medial and lateral femoral condyles. Narrowing of patellofemoral joint space. Small joint effusion. Mild spurring of patella. No acute fracture or subluxation.  IMPRESSION: No acute fracture or subluxation. Osteoarthritic changes as described above.   Electronically Signed   By: Natasha MeadLiviu  Pop M.D.   On: 07/01/2014 08:19     EKG Interpretation None      MDM   Final diagnoses:  Arthralgia    Patient presents to the ER for evaluation of left knee pain. Patient denies trauma. Examination does not reveal any swelling, erythema or warmth over the joint. No appreciable joint effusion on examination. X-ray was negative. Symptoms likely secondary to inflammatory causes, cannot rule out gout. Will treat with  short course of prednisone, Vicodin, rest. Follow-up with primary doctor.    Gilda Creasehristopher J Pollina, MD 07/01/14 [phone]

## 2014-07-02 ENCOUNTER — Other Ambulatory Visit: Payer: Self-pay | Admitting: Otolaryngology

## 2014-07-02 DIAGNOSIS — J329 Chronic sinusitis, unspecified: Secondary | ICD-10-CM

## 2014-07-02 DIAGNOSIS — H6693 Otitis media, unspecified, bilateral: Secondary | ICD-10-CM

## 2014-07-02 DIAGNOSIS — H9203 Otalgia, bilateral: Secondary | ICD-10-CM

## 2014-07-10 ENCOUNTER — Other Ambulatory Visit: Payer: Self-pay | Admitting: Otolaryngology

## 2014-07-10 ENCOUNTER — Ambulatory Visit
Admission: RE | Admit: 2014-07-10 | Discharge: 2014-07-10 | Disposition: A | Discharge status: Home / Self Care | Payer: Medicaid Other | Source: Ambulatory Visit | Attending: Otolaryngology | Admitting: Otolaryngology

## 2014-07-10 ENCOUNTER — Encounter (INDEPENDENT_AMBULATORY_CARE_PROVIDER_SITE_OTHER): Payer: Self-pay

## 2014-07-10 DIAGNOSIS — H6693 Otitis media, unspecified, bilateral: Secondary | ICD-10-CM

## 2014-07-10 DIAGNOSIS — H9203 Otalgia, bilateral: Secondary | ICD-10-CM

## 2014-07-10 DIAGNOSIS — J329 Chronic sinusitis, unspecified: Secondary | ICD-10-CM

## 2014-09-24 ENCOUNTER — Telehealth: Payer: Self-pay | Admitting: Pulmonary Disease

## 2014-09-24 NOTE — Telephone Encounter (Signed)
Called spoke with pt. He reports he received a letter from [facility]) denying his oxygen concentrator stating they did not get the documents they needed from [facility]. I Melissa w/ [facility] to have her check into this and LMTCB x1

## 2014-09-25 NOTE — Telephone Encounter (Signed)
Spoke with Melissa at [facility], states she will look into this and give [place] a call back when she knows what's going on.  Will await call back.

## 2014-09-25 NOTE — Telephone Encounter (Signed)
lmtcb for Melissa.  

## 2014-09-25 NOTE — Telephone Encounter (Signed)
Spoke with Melissa at [facility], per [facility] pt can disregard letter from [facility].  These letters are incorrect, and if [facility] needs any further info they will be contacting pt directly.    Spoke with pt, he is aware.  Nothing further needed at this time.

## 2014-09-25 NOTE — Telephone Encounter (Signed)
Aaron Bishop returned call  °

## 2014-09-25 NOTE — Telephone Encounter (Signed)
LM for Melissa [facility] to return call.  

## 2014-09-25 NOTE — Telephone Encounter (Signed)
Aaron Bishop Returned call  [phone]

## 2014-10-06 ENCOUNTER — Ambulatory Visit (INDEPENDENT_AMBULATORY_CARE_PROVIDER_SITE_OTHER): Payer: Medicaid Other | Admitting: Adult Health

## 2014-10-06 ENCOUNTER — Encounter: Payer: Self-pay | Admitting: Adult Health

## 2014-10-06 VITALS — BP 126/86 | HR 81 | Temp 98.1°F | Ht 69.0 in | Wt >= 6400 oz

## 2014-10-06 DIAGNOSIS — G4733 Obstructive sleep apnea (adult) (pediatric): Secondary | ICD-10-CM | POA: Diagnosis not present

## 2014-10-06 NOTE — Patient Instructions (Signed)
Keep using CPAP at least 6h every night Wear Oxygen 2l/m continuous and At bedtime  With CPAP .  Work on not smoking .  Work on weight loss.  Do not drive if sleepy.  follow up Dr. Alva  In 6 months and As needed     

## 2014-10-06 NOTE — Progress Notes (Signed)
   Subjective:    Patient ID: Aaron Bishop, male    DOB: [birth year], 49 y.o.   MRN: [id number]  HPI   49 y/o morbidly obese smoker, for FU of OSA/ OHS & cor pulmonale  He was hospitalized from 12/2011 for progressive SOB since 3 weeks , leg swelling & acute hypercarbic respiratory failure with ABG of 7.28/66/78. This improved to 7.39/52/69 on discharge on 2 L of oxygen 2D Echocardiogram revealed normal systolic dysfunction, EF 60-65%.  PSG 03/2012 -Severe OSA - AHI 180/h with nadir desatn 56% ! This was corrected by cpap 17 cm to AHI 18/h  Admitted 06/2012 with fluid volume overload and hypercarbic respiratory failure secondary to decompensated OSA/OHS and acute on chronic cor pulmonale -had not obtained CPAP  Echo >> mild LVH, EF 55 to 60%  7.34/72/52  DC'd on auto Cpap & 3L O2  download on auto 10-17 6/6-6/25/14 shows avg pr 17 cm , leak ++, avg usage 5.5.h  07/2013 Had ONO on RA , + desats , continue on O2 with CPAP   10/06/2014 Follow up : Obstructive sleep apnea /OHS/cor pulmonale  Patient returns for a six-month follow-up. He says he's been doing very well since his last visit He wears a C Pap 6-8 hours each night. We discussed weight loss . And not drive if sleepy. He denies any significant daytime sleepiness  Patient does continue to smoke, discussed smoking cessation. He denies any chest pain, orthopnea, increased edema or fever.  Review of Systems Constitutional:   No  weight loss, night sweats,  Fevers, chills, fatigue, or  lassitude.  HEENT:   No headaches,  Difficulty swallowing,  Tooth/dental problems, or  Sore throat,                No sneezing, itching, ear ache, nasal congestion, post nasal drip,   CV:  No chest pain,  Orthopnea, PND, swelling in lower extremities, anasarca, dizziness, palpitations, syncope.   GI  No heartburn, indigestion, abdominal pain, nausea, vomiting, diarrhea, change in bowel habits, loss of appetite, bloody stools.   Resp:   No  chest wall deformity  Skin: no rash or lesions.  GU: no dysuria, change in color of urine, no urgency or frequency.  No flank pain, no hematuria   MS:  No joint pain or swelling.  No decreased range of motion.  No back pain.  Psych:  No change in mood or affect. No depression or anxiety.  No memory loss.         Objective:   Physical Exam  GEN: A/Ox3; pleasant , NAD, morbidly obese Vital signs reviewed HEENT:  [place]/AT,  EACs-clear, TMs-wnl, NOSE-clear, Class 2-3 airway.  THROAT-clear, no lesions, no postnasal drip or exudate noted.   NECK:  Supple w/ Bishop ROM; no JVD; normal carotid impulses w/o bruits; no thyromegaly or nodules palpated; no lymphadenopathy.  RESP  Clear  P & A; w/o, wheezes/ rales/ or rhonchi.no accessory muscle use, no dullness to percussion  CARD:  RRR, no m/r/g  , no peripheral edema, pulses intact, no cyanosis or clubbing.  GI:   Soft & nt; nml bowel sounds; no organomegaly or masses detected.  Musco: Warm bil, no deformities or joint swelling noted.   Neuro: alert, no focal deficits noted.    Skin: Warm, no lesions or rashes        Assessment & Plan:

## 2014-10-07 NOTE — Assessment & Plan Note (Signed)
Wt loss encouraged  

## 2014-10-07 NOTE — Assessment & Plan Note (Signed)
Keep using CPAP at least 6h every night Wear Oxygen 2l/m continuous and At bedtime  With CPAP .  Work on not smoking .  Work on weight loss.  Do not drive if sleepy.  follow up Dr. Vassie Loll  In 6 months and As needed

## 2014-11-23 IMAGING — CR DG CHEST 1V PORT
1 series · 1 of 1 positions shown · non-contrast
Comparison: 07/13/2012

CLINICAL DATA: Respiratory failure

PORTABLE CHEST - 1 VIEW

[AP]
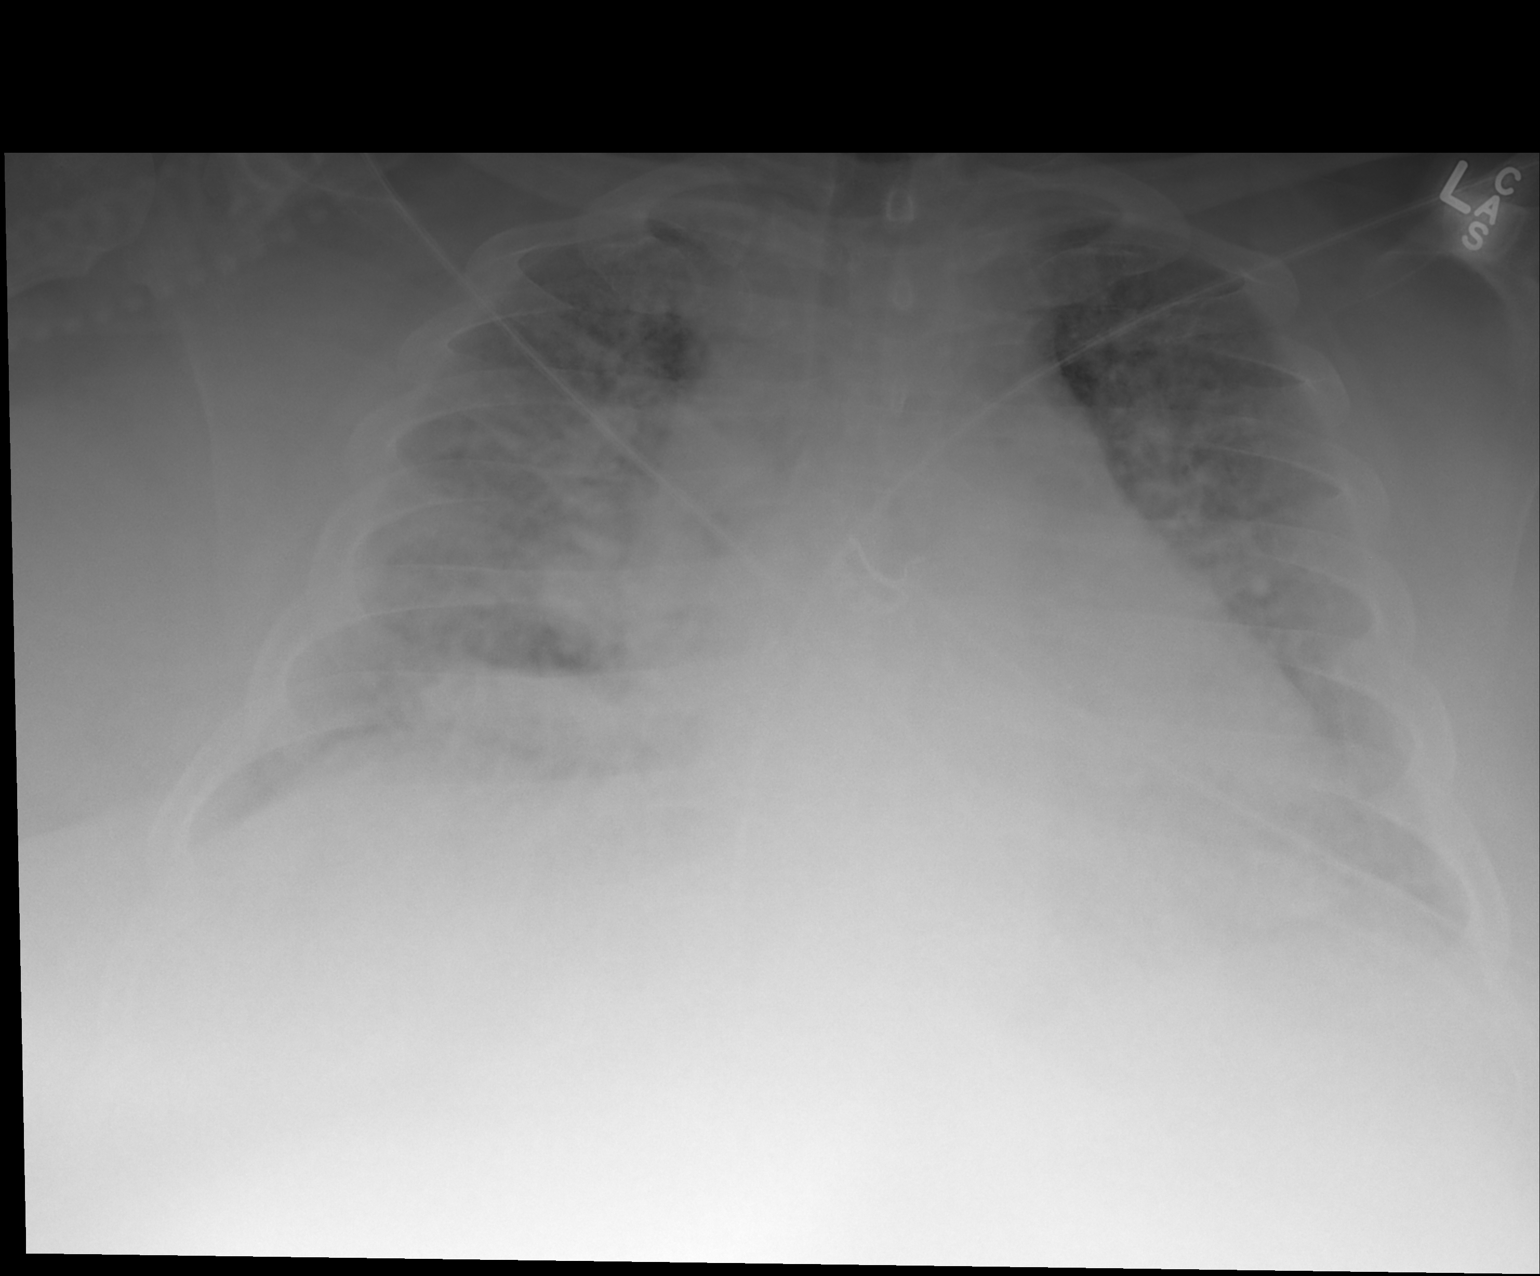

[1 of 1 positions shown; findings below may reference images not displayed]

FINDINGS: Obesity.  Low lung volumes with resultant crowding of
bronchovascular structures.  Bilateral diffuse airspace opacities
stable.  No definite effusion.  Cardiomegaly.
IMPRESSION: 1.  Little change in bilateral airspace disease, emphasized by low
lung volumes.

## 2015-06-01 ENCOUNTER — Emergency Department (HOSPITAL_COMMUNITY)
Admission: EM | Admit: 2015-06-01 | Discharge: 2015-06-01 | Disposition: A | Discharge status: Home / Self Care | Payer: Medicaid Other | Attending: Emergency Medicine | Admitting: Emergency Medicine

## 2015-06-01 ENCOUNTER — Encounter (HOSPITAL_COMMUNITY): Payer: Self-pay | Admitting: Emergency Medicine

## 2015-06-01 DIAGNOSIS — F17210 Nicotine dependence, cigarettes, uncomplicated: Secondary | ICD-10-CM | POA: Insufficient documentation

## 2015-06-01 DIAGNOSIS — K0889 Other specified disorders of teeth and supporting structures: Secondary | ICD-10-CM | POA: Insufficient documentation

## 2015-06-01 DIAGNOSIS — K002 Abnormalities of size and form of teeth: Secondary | ICD-10-CM | POA: Insufficient documentation

## 2015-06-01 DIAGNOSIS — K029 Dental caries, unspecified: Secondary | ICD-10-CM | POA: Insufficient documentation

## 2015-06-01 DIAGNOSIS — K047 Periapical abscess without sinus: Secondary | ICD-10-CM | POA: Insufficient documentation

## 2015-06-01 DIAGNOSIS — I10 Essential (primary) hypertension: Secondary | ICD-10-CM | POA: Diagnosis not present

## 2015-06-01 DIAGNOSIS — J449 Chronic obstructive pulmonary disease, unspecified: Secondary | ICD-10-CM | POA: Diagnosis not present

## 2015-06-01 MED ORDER — BUPIVACAINE HCL (PF) 0.5 % IJ SOLN
10.0000 mL | Freq: Once | INTRAMUSCULAR | Status: AC
  Administered 2015-06-01: 10 mL
  Filled 2015-06-01: qty 10

## 2015-06-01 MED ORDER — PENICILLIN V POTASSIUM 500 MG PO TABS: 500.0000 mg | ORAL_TABLET | Freq: Four times a day (QID) | ORAL | Status: AC

## 2015-06-01 MED ORDER — PENICILLIN V POTASSIUM 250 MG PO TABS
500.0000 mg | ORAL_TABLET | Freq: Once | ORAL | Status: AC
  Administered 2015-06-01: 500 mg via ORAL
  Filled 2015-06-01: qty 2

## 2015-06-01 NOTE — ED Provider Notes (Signed)
CSN: [id number]     Arrival date & time    History   First MD Initiated Contact with Patient 06/01/15 0636     Chief Complaint  Patient presents with  . Facial Pain   (Consider location/radiation/quality/duration/timing/severity/associated sxs/prior Treatment) HPI 50 y.o. male, presents to the Emergency Department today complaining of left sided dental pain. Notes pain is 10/10 and feels like an ache on the left side of his face. Has seen a dentist in the past month and scheduled for surgery for removal of tooth at the end of the month. Given ABX and pain medication until then. Finished course of Amoxicillin 1-2 weeks ago. No difficulty swallowing. No trouble phonating. No fevers. No N/V/D. No other symptoms noted.     Past Medical History  Diagnosis Date  . Hypertension   . Morbid obesity (HCC)   . Asthma   . Hyperlipidemia   . Respiratory failure (HCC)   . COPD (chronic obstructive pulmonary disease) [facility](HCC)    Past Surgical History  Procedure Laterality Date  . No past surgeries     Family History  Problem Relation Age of Onset  . Asthma Mother   . Allergies Mother   . Cancer Maternal Grandmother    Social History  Substance Use Topics  . Smoking status: Current Every Day Smoker -- 0.20 packs/day for 30 years    Types: Cigarettes  . Smokeless tobacco: Never Used  . Alcohol Use: 0.6 oz/week    1 Cans of beer per week     Comment: daily    Review of Systems ROS reviewed and all are negative for acute change except as noted in the HPI.  Allergies  Review of patient's allergies indicates no known allergies.  Home Medications   Prior to Admission medications   Medication Sig Start Date End Date Taking? Authorizing Provider  acetaminophen (TYLENOL) 325 MG tablet Take 650 mg by mouth every 6 (six) hours as needed for moderate pain.   Yes Historical Provider, MD  albuterol (PROVENTIL) (2.5 MG/3ML) 0.083% nebulizer solution Take 2.5 mg by nebulization every 6 (six) hours  as needed for wheezing or shortness of breath.   Yes Historical Provider, MD  amLODipine (NORVASC) 10 MG tablet Take 10 mg by mouth daily.   Yes Historical Provider, MD  fluticasone (FLONASE) 50 MCG/ACT nasal spray Place 2 sprays into both nostrils daily as needed for allergies.  09/04/14  Yes Historical Provider, MD  furosemide (LASIX) 40 MG tablet Take 40 mg by mouth daily. 08/09/12  Yes Oretha Milchakesh Alva V, MD  HYDROcodone-acetaminophen (NORCO/VICODIN) 5-325 MG per tablet Take 2 tablets by mouth every 4 (four) hours as needed for moderate pain. 07/01/14  Yes Gilda Creasehristopher J Pollina, MD  lisinopril (PRINIVIL,ZESTRIL) 20 MG tablet Take 20 mg by mouth daily.   Yes Historical Provider, MD  loratadine (CLARITIN) 10 MG tablet Take 10 mg by mouth daily.   Yes Historical Provider, MD  metFORMIN (GLUCOPHAGE) 500 MG tablet Take 1 tablet (500 mg total) by mouth daily with breakfast. 07/19/12  Yes Nyoka CowdenMichael B Wert, MD   Ht 5\' 9"  (1.753 m)  Wt 191.418 kg  BMI 62.29 kg/m2 Physical Exam  Constitutional: He is oriented to person, place, and time. He appears well-developed and well-nourished.  HENT:  Head: Normocephalic and atraumatic.  Mouth/Throat: Uvula is midline and mucous membranes are normal. No trismus in the jaw. Abnormal dentition. Dental abscesses and dental caries present. No posterior oropharyngeal erythema.    Eyes: EOM are normal. Pupils are  equal, round, and reactive to light.  Cardiovascular: Normal rate and regular rhythm.   Pulmonary/Chest: Effort normal.  Abdominal: Soft.  Musculoskeletal: Normal range of motion.  Neurological: He is alert and oriented to person, place, and time.  Skin: Skin is warm and dry.  Psychiatric: He has a normal mood and affect. His behavior is normal. Thought content normal.  Nursing note and vitals reviewed.   ED Course  Procedures (including critical care time)     Dental NERVE BLOCK Performed by: Eston Esters Consent: Verbal consent obtained. Required  items: required blood products, implants, devices, and special equipment available Time out: Immediately prior to procedure a "time out" was called to verify the correct patient, procedure, equipment, support staff and site/side marked as required.  Indication: Dental Pain Nerve block body site: Left Buccal Mucosa   Preparation: Patient was prepped and draped in the usual sterile fashion. Needle gauge: 24 G Location technique: anatomical landmarks  Local anesthetic: Bupivicaine  Anesthetic total: 6 ml  Outcome: pain improved Patient tolerance: Patient tolerated the procedure well with no immediate complications.  Labs Review Labs Reviewed - No data to display  Imaging Review No results found. I have personally reviewed and evaluated these images and lab results as part of my medical decision-making.   EKG Interpretation None      MDM  I have reviewed the relevant previous healthcare records. I obtained HPI from historian. Patient discussed with supervising physician  ED Course:  Assessment: Dental pain associated with dental cary but no signs or symptoms of dental abscess with patient afebrile, non toxic appearing and swallowing secretions well. Exam unconcerning for Ludwig's angina or other deep tissue infection in neck. As there is gum swelling, erythema, and facial swelling, will treat with antibiotic and pain medicine. Dental block performed in ED. Urged patient to follow-up with dentist.   Pt will follow up with dentist for scheduled surgery. Patient voices understanding and is agreeable to plan.  Disposition/Plan:  DC Home Additional Verbal discharge instructions given and discussed with patient.  Pt Instructed to f/u with Dentist for evaluation and treatment of symptoms. Return precautions given Pt acknowledges and agrees with plan  Supervising Physician Azalia Bilis, MD   Final diagnoses:  Pain, dental       Audry Pili, PA-C 06/01/15 1191  Azalia Bilis, MD 06/01/15 [phone]

## 2015-06-01 NOTE — ED Notes (Signed)
Pt verbalized understanding of d/c instructions, prescriptions, and follow-up care. No further questions/concerns, VSS, ambulatory w/ steady gait (refused wheelchair) 

## 2015-06-01 NOTE — ED Notes (Signed)
C/o pain in left side of face.  Reports seeing the dentist last month and was told he has a cracked tooth and is scheduled for surgery the end of the month.  Reports that the pain is the same but more intense.

## 2015-06-01 NOTE — Discharge Instructions (Signed)
Please read and follow all provided instructions.  Your diagnoses today include:  1. Pain, dental    Tests performed today include:  Vital signs. See below for your results today.   Medications prescribed:   Take as prescribed   Home care instructions:  Follow any educational materials contained in this packet.  Follow-up instructions: Please follow-up with your dentist for further evaluation of symptoms and treatment   Return instructions:   Please return to the Emergency Department if you do not get better, if you get worse, or new symptoms OR  - Fever (temperature greater than 101.6F)  - Bleeding that does not stop with holding pressure to the area    -Severe pain (please note that you may be more sore the day after your accident)  - Chest Pain  - Difficulty breathing  - Severe nausea or vomiting  - Inability to tolerate food and liquids  - Passing out  - Skin becoming red around your wounds  - Change in mental status (confusion or lethargy)  - New numbness or weakness     Please return if you have any other emergent concerns.  Additional Information:  Your vital signs today were: Ht 5\' 9"  (1.753 m)   Wt 191.418 kg   BMI 62.29 kg/m2 If your blood pressure (BP) was elevated above 135/85 this visit, please have this repeated by your doctor within one month. ---------------

## 2015-06-30 ENCOUNTER — Ambulatory Visit (INDEPENDENT_AMBULATORY_CARE_PROVIDER_SITE_OTHER): Payer: Medicaid Other | Admitting: Pulmonary Disease

## 2015-06-30 ENCOUNTER — Encounter: Payer: Self-pay | Admitting: Pulmonary Disease

## 2015-06-30 VITALS — BP 116/70 | HR 92 | Ht 69.0 in | Wt >= 6400 oz

## 2015-06-30 DIAGNOSIS — E662 Morbid (severe) obesity with alveolar hypoventilation: Secondary | ICD-10-CM | POA: Diagnosis not present

## 2015-06-30 DIAGNOSIS — G4733 Obstructive sleep apnea (adult) (pediatric): Secondary | ICD-10-CM

## 2015-06-30 NOTE — Assessment & Plan Note (Signed)
He seems to be doing well on CPAP and does not require  BiPAP

## 2015-06-30 NOTE — Assessment & Plan Note (Signed)
CPAP supplies will be renewed for a year Good luck with weight loss-connect with the bariatric surgery program  Weight loss encouraged, compliance with goal of at least 4-6 hrs every night is the expectation. Advised against medications with sedative side effects Cautioned against driving when sleepy - understanding that sleepiness will vary on a day to day basis

## 2015-06-30 NOTE — Progress Notes (Signed)
   Subjective:    Patient ID: Aaron Bishop, male    DOB: 03/23/1965, 50 y.o.   MRN: [id number]  HPI  50 y/o morbidly obese smoker, for FU of OSA/ OHS & cor pulmonale  He was hospitalized from 12/2011 for progressive SOB since 3 weeks , leg swelling & acute hypercarbic respiratory failure with ABG of 7.28/66/78. This improved to 7.39/52/69 on discharge on 2 L of oxygen Admitted 06/2012 with fluid volume overload and hypercarbic respiratory failure secondary to decompensated OSA/OHS and acute on chronic cor pulmonale -had not obtained CPAP  Echo >> mild LVH, EF 55 to 60%  7.34/72/52  DC'd on auto Cpap & 3L O2     06/30/2015  Chief Complaint  Patient presents with  . Follow-up    doing well on cpap, no concerns.   He continues on CPAP and oxygen He is very compliant, denies daytime somnolence or fatigue He has been unable to lose weight-he shows me his study with an non-FDA approved natural therapy drugs and wants opinion about whether he can use this. He was turned down by bariatric surgery due to Medicaid only-and he wonders if he can go to [place] and get surgery  His weight is unchanged he continues to smoke CPAP download on auto 10-17 cm shows average pressure of 17 cm with no residual and large leak, he has just obtained a new nasal mask and states that his leak has subsided  Significant tests/ events  PSG 03/2012 -Severe OSA - AHI 180/h with nadir desatn 56% ! This was corrected by cpap 17 cm to AHI 18/h   07/2013 Had ONO on RA , + desats , continue on O2 with CPAP  Review of Systems Patient denies significant dyspnea,cough, hemoptysis,  chest pain, palpitations, pedal edema, orthopnea, paroxysmal nocturnal dyspnea, lightheadedness, nausea, vomiting, abdominal or  leg pains      Objective:   Physical Exam  Gen. Pleasant, obese, in no distress ENT - no lesions, no post nasal drip Neck: No JVD, no thyromegaly, no carotid bruits Lungs: no use of accessory muscles, no  dullness to percussion, decreased without rales or rhonchi  Cardiovascular: Rhythm regular, heart sounds  normal, no murmurs or gallops, no peripheral edema Musculoskeletal: No deformities, no cyanosis or clubbing , no tremors       Assessment & Plan:

## 2015-06-30 NOTE — Patient Instructions (Signed)
CPAP supplies will be renewed for a year Good luck with weight loss-connect with the bariatric surgery program

## 2015-07-08 ENCOUNTER — Encounter: Payer: Self-pay | Admitting: Pulmonary Disease

## 2016-04-20 ENCOUNTER — Encounter: Payer: Self-pay | Admitting: Pulmonary Disease

## 2016-04-20 ENCOUNTER — Ambulatory Visit (INDEPENDENT_AMBULATORY_CARE_PROVIDER_SITE_OTHER): Payer: Medicaid Other | Admitting: Pulmonary Disease

## 2016-04-20 DIAGNOSIS — G4733 Obstructive sleep apnea (adult) (pediatric): Secondary | ICD-10-CM

## 2016-04-20 DIAGNOSIS — Z720 Tobacco use: Secondary | ICD-10-CM | POA: Insufficient documentation

## 2016-04-20 NOTE — Assessment & Plan Note (Signed)
Weight loss encouraged, compliance with goal of at least 4-6 hrs every night is the expectation. Advised against medications with sedative side effects Cautioned against driving when sleepy - understanding that sleepiness will vary on a day to day basis  

## 2016-04-20 NOTE — Patient Instructions (Signed)
Continue using CPAP every night. Start using nicotine patch to help her quit smoking

## 2016-04-20 NOTE — Assessment & Plan Note (Signed)
Counseled regarding smoking cessation. We will attempt using nicotine patch

## 2016-04-20 NOTE — Progress Notes (Signed)
   Subjective:    Patient ID: Aaron Bishop, male    DOB: 04/05/1965, 51 y.o.   MRN: [id number]  HPI  51 y/o morbidly obese smoker, for FU of OSA/ OHS & cor pulmonale  He was hospitalized in 12/2011 & again in 06/2012 with fluid  overload and hypercarbic respiratory failure secondary to decompensated OSA/OHS and  cor pulmonale -  DC'd on auto Cpap & 3L O2   04/20/2016  Chief Complaint  Patient presents with  . Follow-up    f/u for OSA. Machine has been working well for pt. Denies any breathing concerns.    Annual follow-up. He has lost about 20 pounds from last year, he is using a diet pill. He has held off on bariatric surgery evaluation he has an abdominal hernia and is awaiting surgical evaluation for this. CPAP is working well, he only gets her fullface mask replacement every year but denies leak or mask and pressure issues. He reports good compliance unfortunately does not have his card and we cannot reviewed download today. He continues to smoke 2-3 cigarettes a day. He is concerned about weight gain after smoking cessation  CPAP download from 2017  on auto 10-17 cm shows average pressure of 17 cm with no residual and large leak  Significant tests/ events  PSG 03/2012 -Severe OSA - AHI 180/h with nadir desatn 56% ! This was corrected by cpap 17 cm to AHI 18/h   07/2013 Had ONO on RA , + desats , continue on O2 with CPAP   Review of Systems Patient denies significant dyspnea,cough, hemoptysis,  chest pain, palpitations, pedal edema, orthopnea, paroxysmal nocturnal dyspnea, lightheadedness, nausea, vomiting, abdominal or  leg pains      Objective:   Physical Exam  Gen. Pleasant, obese, in no distress ENT - no lesions, no post nasal drip Neck: No JVD, no thyromegaly, no carotid bruits Lungs: no use of accessory muscles, no dullness to percussion, decreased without rales or rhonchi  Cardiovascular: Rhythm regular, heart sounds  normal, no murmurs or gallops, no  peripheral edema Musculoskeletal: No deformities, no cyanosis or clubbing , no tremors       Assessment & Plan:

## 2016-04-20 NOTE — Assessment & Plan Note (Signed)
Weight loss encouraged. Bariatric surgery evaluation if he gets down to 400 pounds

## 2016-04-21 ENCOUNTER — Encounter: Payer: Self-pay | Admitting: Adult Health

## 2017-04-20 ENCOUNTER — Ambulatory Visit: Payer: Self-pay | Admitting: Adult Health

## 2017-04-24 ENCOUNTER — Encounter: Payer: Self-pay | Admitting: Adult Health

## 2017-04-24 ENCOUNTER — Ambulatory Visit: Payer: Medicaid Other | Admitting: Adult Health

## 2017-04-24 DIAGNOSIS — G4733 Obstructive sleep apnea (adult) (pediatric): Secondary | ICD-10-CM | POA: Diagnosis not present

## 2017-04-24 DIAGNOSIS — E662 Morbid (severe) obesity with alveolar hypoventilation: Secondary | ICD-10-CM

## 2017-04-24 NOTE — Progress Notes (Signed)
@Patient  ID: Aaron Bishop, male    DOB: 01-10-1966, 52 y.o.   MRN: [id number]  Chief Complaint  Patient presents with  . Follow-up    OSA    Referring provider: Orpah Cobb, MD  HPI: 52 year old male followed for obstructive sleep apnea and OHS with cor pulmonale and hypercarbic respiratory failure  Significant tests/ events  PSG 03/2012 -Severe OSA - AHI 180/h with nadir desatn 56% ! This was corrected by cpap 17 cm to AHI 18/h   07/2013 Had ONO on RA , + desats , continue on O2 with CPAP  04/24/2017 Follow up : OSA /OHS  Patient presents for a one year follow-up for sleep apnea and OHS.  Patient is on CPAP at bedtime with oxygen.  Patient says he is doing well.  Feels rested. Cant make it without it .  Patient download is excellent compliance with 100% usage.  Average daily usage at 7 hours.  Patient is on auto set CPAP 10-17 cm H2O.  AHI 2.2.   No Known Allergies  There is no immunization history for the selected administration types on file for this patient.  Past Medical History:  Diagnosis Date  . Asthma   . COPD (chronic obstructive pulmonary disease) (HCC)   . Hyperlipidemia   . Hypertension   . Morbid obesity (HCC)   . Respiratory failure (HCC)     Tobacco History: Social History   Tobacco Use  Smoking Status Current Every Day Smoker  . Packs/day: 0.20  . Years: 30.00  . Pack years: 6.00  . Types: Cigarettes  Smokeless Tobacco Never Used  Tobacco Comment   2 cigarettes daily   Ready to quit: Not Answered Counseling given: Not Answered Comment: 2 cigarettes daily   Outpatient Encounter Medications as of 04/24/2017  Medication Sig  . acetaminophen (TYLENOL) 325 MG tablet Take 650 mg by mouth every 6 (six) hours as needed for moderate pain.  Marland Kitchen albuterol (PROVENTIL) (2.5 MG/3ML) 0.083% nebulizer solution Take 2.5 mg by nebulization every 6 (six) hours as needed for wheezing or shortness of breath.  Marland Kitchen amLODipine (NORVASC) 10 MG tablet Take 10 mg  by mouth daily.  . fluticasone (FLONASE) 50 MCG/ACT nasal spray Place 2 sprays into both nostrils daily as needed for allergies.   . furosemide (LASIX) 40 MG tablet Take 40 mg by mouth daily.  Marland Kitchen lisinopril (PRINIVIL,ZESTRIL) 20 MG tablet Take 20 mg by mouth daily.  Marland Kitchen loratadine (CLARITIN) 10 MG tablet Take 10 mg by mouth daily.  . metFORMIN (GLUCOPHAGE) 500 MG tablet Take 1 tablet (500 mg total) by mouth daily with breakfast.  . pravastatin (PRAVACHOL) 40 MG tablet TK 1 T PO  IN THE EVENING   No facility-administered encounter medications on file as of 04/24/2017.      Review of Systems  Constitutional:   No  weight loss, night sweats,  Fevers, chills, fatigue, or  lassitude.  HEENT:   No headaches,  Difficulty swallowing,  Tooth/dental problems, or  Sore throat,                No sneezing, itching, ear ache, nasal congestion, post nasal drip,   CV:  No chest pain,  Orthopnea, PND, swelling in lower extremities, anasarca, dizziness, palpitations, syncope.   GI  No heartburn, indigestion, abdominal pain, nausea, vomiting, diarrhea, change in bowel habits, loss of appetite, bloody stools.   Resp: No shortness of breath with exertion or at rest.  No excess mucus, no productive cough,  No  non-productive cough,  No coughing up of blood.  No change in color of mucus.  No wheezing.  No chest wall deformity  Skin: no rash or lesions.  GU: no dysuria, change in color of urine, no urgency or frequency.  No flank pain, no hematuria   MS:  No joint pain or swelling.  No decreased range of motion.  No back pain.    Physical Exam  BP 136/80 (BP Location: Right Arm, Cuff Size: Large)   Pulse 96   Ht 5\' 9"  (1.753 m)   Wt (!) 449 lb 9.6 oz (203.9 kg)   SpO2 92%   BMI 66.39 kg/m   GEN: A/Ox3; pleasant , NAD, obese   HEENT:  [place]/AT,  EACs-clear, TMs-wnl, NOSE-clear, THROAT-clear, no lesions, no postnasal drip or exudate noted.  Class III MP airway   NECK:  Supple w/ Bishop ROM; no JVD; normal  carotid impulses w/o bruits; no thyromegaly or nodules palpated; no lymphadenopathy.    RESP  Clear  P & A; w/o, wheezes/ rales/ or rhonchi. no accessory muscle use, no dullness to percussion  CARD:  RRR, no m/r/g, none to trace  peripheral edema, pulses intact, no cyanosis or clubbing.  GI:   Soft & nt; nml bowel sounds; no organomegaly or masses detected.   Musco: Warm bil, no deformities or joint swelling noted.   Neuro: alert, no focal deficits noted.    Skin: Warm, no lesions or rashes    Lab Results:  CBC  BNP No results found for: BNP  ProBNP  Imaging: No results found.   Assessment & Plan:   OSA (obstructive sleep apnea) Excellent control and compliance on CPAP at bedtime with oxygen  Plan  Patient Instructions  Keep up the good work Continue on CPAP at bedtime Work on healthy weight Do not drive a sleepy Follow-up with Dr. Vassie LollAlva in 1 year and as needed     Obesity hypoventilation syndrome Cont on CPAP At bedtime  With O2   Plan  Patient Instructions  Keep up the good work Continue on CPAP at bedtime Work on healthy weight Do not drive a sleepy Follow-up with Dr. Vassie LollAlva in 1 year and as needed     Morbid obesity Wt loss      Aaron Oaksammy Parrett, NP 04/24/2017

## 2017-04-24 NOTE — Assessment & Plan Note (Signed)
Wt loss  

## 2017-04-24 NOTE — Assessment & Plan Note (Signed)
Excellent control and compliance on CPAP at bedtime with oxygen  Plan  Patient Instructions  Keep up the good work Continue on CPAP at bedtime Work on healthy weight Do not drive a sleepy Follow-up with Dr. Vassie LollAlva in 1 year and as needed

## 2017-04-24 NOTE — Patient Instructions (Signed)
Keep up the good work Continue on CPAP at bedtime Work on healthy weight Do not drive a sleepy Follow-up with Dr. Alva in 1 year and as needed 

## 2017-04-24 NOTE — Addendum Note (Signed)
Addended by: Boone MasterJONES, JESSICA E on: 04/24/2017 10:16 AM   Modules accepted: Orders

## 2017-04-24 NOTE — Assessment & Plan Note (Signed)
Cont on CPAP At bedtime  With O2   Plan  Patient Instructions  Keep up the good work Continue on CPAP at bedtime Work on healthy weight Do not drive a sleepy Follow-up with Dr. Vassie LollAlva in 1 year and as needed

## 2017-05-24 ENCOUNTER — Telehealth: Payer: Self-pay | Admitting: Pulmonary Disease

## 2017-05-24 NOTE — Telephone Encounter (Signed)
Called and spoke to patient. Patient stated that he had called [facility] about his oxygen that connects to his CPAP is not working. [facility] phone but were unable to do so. [facility] needed to [facility] us.  [facility] She stated they don't have a record of anyone talking to the patient. She agreed to call the patient and will attempt to trouble shoot over the phone and if that does not work will have someone go out to see what the problem is.  Called the patient back and let him know to [facility]. Patient thanked staff for calling. Nothing further is needed at this time.

## 2018-04-24 ENCOUNTER — Encounter: Payer: Self-pay | Admitting: Pulmonary Disease

## 2018-04-24 ENCOUNTER — Ambulatory Visit (INDEPENDENT_AMBULATORY_CARE_PROVIDER_SITE_OTHER): Payer: Medicaid Other | Admitting: Pulmonary Disease

## 2018-04-24 DIAGNOSIS — Z720 Tobacco use: Secondary | ICD-10-CM

## 2018-04-24 DIAGNOSIS — G4733 Obstructive sleep apnea (adult) (pediatric): Secondary | ICD-10-CM

## 2018-04-24 NOTE — Assessment & Plan Note (Signed)
Cessation discussed 

## 2018-04-24 NOTE — Progress Notes (Signed)
   Subjective:    Patient ID: Aaron Bishop, male    DOB: 1965/04/18, 53 y.o.   MRN: [id number]  HPI  53 yo morbidly obese smoker, for FU of OSA/ OHS &cor pulmonale  He was hospitalized in 12/2011 & again in 06/2012 with fluid  overload and hypercarbic respiratory failure secondary to decompensated OSA/OHS and  cor pulmonale -  DC'd on auto Cpap &3L O2   Annual routine follow-up for OSA/OHS. Denies shortness of breath or pedal edema. He is compliant with his machine, uses full facemask, no problems with mask or pressure. Download was reviewed which shows average pressure of 16 cm on auto settings 10 to 17 cm with large mask leak and good control of events. He denies daytime somnolence or sleep pressure in the afternoon. Weight is unchanged. He tried surgical evaluation 3 years ago and was told at [facility] health bariatric program does not accept Medicaid  He pulled a muscle on his left shoulder  Significant tests/ events reviewed  PSG 03/2012 -Severe OSA - AHI 180/h with nadir desatn 56% ! This was corrected by cpap 17 cm to AHI 18/h   07/2013 Had ONO on RA , + desats , continue on O2 with CPAP  Review of Systems neg for any significant sore throat, dysphagia, itching, sneezing, nasal congestion or excess/ purulent secretions, fever, chills, sweats, unintended wt loss, pleuritic or exertional cp, hempoptysis, orthopnea pnd or change in chronic leg swelling. Also denies presyncope, palpitations, heartburn, abdominal pain, nausea, vomiting, diarrhea or change in bowel or urinary habits, dysuria,hematuria, rash, arthralgias, visual complaints, headache, numbness weakness or ataxia.     Objective:   Physical Exam  Gen. Pleasant, obese, in no distress ENT - no lesions, no post nasal drip Neck: No JVD, no thyromegaly, no carotid bruits Lungs: no use of accessory muscles, no dullness to percussion, decreased without rales or rhonchi  Cardiovascular: Rhythm regular, heart sounds  normal,  no murmurs or gallops, no peripheral edema Musculoskeletal: No deformities, no cyanosis or clubbing , no tremors       Assessment & Plan:

## 2018-04-24 NOTE — Assessment & Plan Note (Signed)
Trial of air fit F 30 fullface mask, this may decrease leak. CPAP is working well otherwise -he is compliant CPAP supplies will be renewed for a year Check with bariatric program at [facility] health or at [facility]  Weight loss encouraged, compliance with goal of at least 4-6 hrs every night is the expectation. Advised against medications with sedative side effects Cautioned against driving when sleepy - understanding that sleepiness will vary on a day to day basis

## 2018-04-24 NOTE — Patient Instructions (Signed)
Trial of air fit F 30 fullface mask, this may decrease leak. CPAP is working well otherwise  Check with bariatric program at [facility] health or at [facility] to use Zyrtec for allergies. Okay to use ibuprofen twice daily for 3 days for pulled muscle

## 2018-04-24 NOTE — Assessment & Plan Note (Signed)
He will check our bariatric program at [facility] and [place]

## 2018-05-03 ENCOUNTER — Telehealth: Payer: Self-pay | Admitting: Pulmonary Disease

## 2018-05-03 NOTE — Telephone Encounter (Signed)
Called and spoke with patient, due to his insurance he is not eligible for another face mask until June. Patient wants to know if RA has any other options. Please advise, thank you.

## 2018-05-03 NOTE — Telephone Encounter (Signed)
Spoke with pt and relayed Dr Reginia Naas recommendation of air fit f 30 full face mask.  Nothing further needed.

## 2018-05-04 NOTE — Telephone Encounter (Signed)
He can go to sleep lab for mask desensitization and be fitted. Currently they are not accepting patients due to pandemic but he can check with them in 1 month [phone]

## 2018-05-04 NOTE — Telephone Encounter (Signed)
Called patient unable to reach and unable to leave VM 

## 2018-05-04 NOTE — Telephone Encounter (Signed)
Called and spoke with pt stating to him the info per RA. Pt expressed understanding and stated at this time he could not write down the phone number as he didn't have anything to write it down with but stated he would call us back later to get the number of the sleep lab from [place]. Will await a return call from pt.

## 2018-05-10 NOTE — Telephone Encounter (Signed)
ATC, no answer and there was no option to leave a message. 

## 2018-05-11 NOTE — Telephone Encounter (Signed)
Called and spoke with pt and provided him the number for the sleep lab. Pt restated the phone number to me to make sure he had written the phone number down correctly. Stated to pt to call that number and they should be able to help him out. Pt expressed understanding. Nothing further needed.

## 2018-05-23 ENCOUNTER — Other Ambulatory Visit: Payer: Self-pay

## 2018-05-23 ENCOUNTER — Emergency Department (HOSPITAL_COMMUNITY)
Admission: EM | Admit: 2018-05-23 | Discharge: 2018-05-23 | Disposition: A | Discharge status: Home / Self Care | Payer: Medicaid Other | Attending: Emergency Medicine | Admitting: Emergency Medicine

## 2018-05-23 ENCOUNTER — Encounter (HOSPITAL_COMMUNITY): Payer: Self-pay | Admitting: Obstetrics and Gynecology

## 2018-05-23 DIAGNOSIS — I10 Essential (primary) hypertension: Secondary | ICD-10-CM | POA: Insufficient documentation

## 2018-05-23 DIAGNOSIS — E119 Type 2 diabetes mellitus without complications: Secondary | ICD-10-CM | POA: Diagnosis not present

## 2018-05-23 DIAGNOSIS — T23061A Burn of unspecified degree of back of right hand, initial encounter: Secondary | ICD-10-CM | POA: Diagnosis present

## 2018-05-23 DIAGNOSIS — X19XXXA Contact with other heat and hot substances, initial encounter: Secondary | ICD-10-CM | POA: Insufficient documentation

## 2018-05-23 DIAGNOSIS — T23231A Burn of second degree of multiple right fingers (nail), not including thumb, initial encounter: Secondary | ICD-10-CM | POA: Insufficient documentation

## 2018-05-23 DIAGNOSIS — Z79899 Other long term (current) drug therapy: Secondary | ICD-10-CM | POA: Diagnosis not present

## 2018-05-23 DIAGNOSIS — Z23 Encounter for immunization: Secondary | ICD-10-CM | POA: Diagnosis not present

## 2018-05-23 DIAGNOSIS — F17210 Nicotine dependence, cigarettes, uncomplicated: Secondary | ICD-10-CM | POA: Insufficient documentation

## 2018-05-23 DIAGNOSIS — Y92810 Car as the place of occurrence of the external cause: Secondary | ICD-10-CM | POA: Diagnosis not present

## 2018-05-23 DIAGNOSIS — Y9389 Activity, other specified: Secondary | ICD-10-CM | POA: Insufficient documentation

## 2018-05-23 DIAGNOSIS — Z7984 Long term (current) use of oral hypoglycemic drugs: Secondary | ICD-10-CM | POA: Insufficient documentation

## 2018-05-23 DIAGNOSIS — Y999 Unspecified external cause status: Secondary | ICD-10-CM | POA: Insufficient documentation

## 2018-05-23 MED ORDER — TETANUS-DIPHTH-ACELL PERTUSSIS 5-2.5-18.5 LF-MCG/0.5 IM SUSP
0.5000 mL | Freq: Once | INTRAMUSCULAR | Status: AC
  Administered 2018-05-23: 0.5 mL via INTRAMUSCULAR
  Filled 2018-05-23: qty 0.5

## 2018-05-23 MED ORDER — SILVER SULFADIAZINE 1 % EX CREA
TOPICAL_CREAM | Freq: Once | CUTANEOUS | Status: AC
  Administered 2018-05-23: 13:00:00 via TOPICAL
  Filled 2018-05-23: qty 50

## 2018-05-23 NOTE — ED Triage Notes (Signed)
Pt reports he burned his hand on the hose under his car good. Pt has obvious blisters to the hand. Pt reports 10/10 pain

## 2018-05-23 NOTE — ED Provider Notes (Signed)
[facility]-EMERGENCY DEPT Provider Note   CSN: [id number] Arrival date & time: 05/23/18  1226    History   Chief Complaint Chief Complaint  Patient presents with  . Hand Burn    HPI Aaron Bishop is a 53 y.o. male with past medical history of hypertension, hyperlipidemia, morbid obesity presents to ED for dominant right hand burn that occurred just prior to arrival.  States that he was looking at the hood of his car when some type of liquid gushed out and hit his hand.  He put triple antibiotic ointment on the area.  He is unsure of last tetanus.     HPI  Past Medical History:  Diagnosis Date  . Asthma   . COPD (chronic obstructive pulmonary disease) (HCC)   . Hyperlipidemia   . Hypertension   . Morbid obesity (HCC)   . Respiratory failure [facility])     Patient Active Problem List   Diagnosis Date Noted  . Tobacco abuse 04/20/2016  . Umbilical hernia 04/16/2013  . Obesity hypoventilation syndrome (HCC) 07/15/2012  . Cor pulmonale, acute (HCC) 07/12/2012  . Hyperglycemia 07/12/2012  . OSA (obstructive sleep apnea) 12/27/2011  . Morbid obesity (HCC) 12/27/2011  . Hypertension 12/27/2011  . Bilateral leg edema 12/27/2011    Past Surgical History:  Procedure Laterality Date  . NO PAST SURGERIES          Home Medications    Prior to Admission medications   Medication Sig Start Date End Date Taking? Authorizing Provider  acetaminophen (TYLENOL) 325 MG tablet Take 650 mg by mouth every 6 (six) hours as needed for moderate pain.    [provider]  albuterol (PROVENTIL) (2.5 MG/3ML) 0.083% nebulizer solution Take 2.5 mg by nebulization every 6 (six) hours as needed for wheezing or shortness of breath.    [provider]  amLODipine (NORVASC) 10 MG tablet Take 10 mg by mouth daily.    [provider]  fluticasone (FLONASE) 50 MCG/ACT nasal spray Place 2 sprays into both nostrils daily as needed for allergies.  09/04/14    [provider]  furosemide (LASIX) 40 MG tablet Take 40 mg by mouth daily. 08/09/12   Oretha Milch, MD  lisinopril (PRINIVIL,ZESTRIL) 20 MG tablet Take 20 mg by mouth daily.    [provider]  loratadine (CLARITIN) 10 MG tablet Take 10 mg by mouth daily.    [provider]  metFORMIN (GLUCOPHAGE) 500 MG tablet Take 1 tablet (500 mg total) by mouth daily with breakfast. 07/19/12   Nyoka Cowden, MD  pravastatin (PRAVACHOL) 40 MG tablet TK 1 T PO  IN THE EVENING 06/08/15   [provider]    Family History Family History  Problem Relation Age of Onset  . Asthma Mother   . Allergies Mother   . Cancer Maternal Grandmother     Social History Social History   Tobacco Use  . Smoking status: Current Every Day Smoker    Packs/day: 0.20    Years: 30.00    Pack years: 6.00    Types: Cigarettes  . Smokeless tobacco: Never Used  . Tobacco comment: 2 cigarettes daily  Substance Use Topics  . Alcohol use: Yes    Alcohol/week: 1.0 standard drinks    Types: 1 Cans of beer per week    Comment: daily  . Drug use: Yes    Types: Marijuana     Allergies   Patient has no known allergies.   Review  of Systems Review of Systems  Constitutional: Negative for chills and fever.  Skin: Positive for wound.  Neurological: Negative for numbness.     Physical Exam Updated Vital Signs BP (!) 164/100 (BP Location: Left Arm)   Pulse 88   Temp 98 F (36.7 C) (Oral)   Resp 16   Ht  (1.753 m)   Wt (!) 199.1 kg   SpO2 95%   BMI 64.83 kg/m   Physical Exam Vitals signs and nursing note reviewed.  Constitutional:      General: He is not in acute distress.    Appearance: He is well-developed. He is not diaphoretic.  HENT:     Head: Normocephalic and atraumatic.  Eyes:     General: No scleral icterus.    Conjunctiva/sclera: Conjunctivae normal.  Neck:     Musculoskeletal: Normal range of motion.  Pulmonary:     Effort: Pulmonary effort is  normal. No respiratory distress.  Musculoskeletal:     Comments: 2+ radial pulse noted bilaterally.  Compartments are soft.  Full active and passive range of motion of digits of right hand without difficulty.  Skin:    Capillary Refill: Capillary refill takes less than 2 seconds.     Findings: No rash.     Comments: 3 blisters total noted on the dorsum side of the right second and third digit.  Surrounding erythema.  Neurological:     Mental Status: He is alert.      ED Treatments / Results  Labs (all labs ordered are listed, but only abnormal results are displayed) Labs Reviewed - No data to display  EKG None  Radiology No results found.  Procedures Procedures (including critical care time)  Medications Ordered in ED Medications  Tdap (BOOSTRIX) injection 0.5 mL (has no administration in time range)  silver sulfADIAZINE (SILVADENE) 1 % cream ( Topical Given 05/23/18 1301)     Initial Impression / Assessment and Plan / ED Course  I have reviewed the triage vital signs and the nursing notes.  Pertinent labs & imaging results that were available during my care of the patient were reviewed by me and considered in my medical decision making (see chart for details).        53 year old male presents to ED for burn to right hand that occurred just prior to arrival.  He was looking in his car and was burned with some type of liquid.  He put triple antibiotic ointment after cleaning the area.  Second-degree burn noted on my exam of the second and third digits of the right hand.  There is blistering and erythema noted.  Compartments are soft, normal capillary refill and is neurovascularly intact.  He is able to move digits without difficulty.  Will provide Silvadene cream and have him follow-up with PCP.  Patient is hemodynamically stable, in NAD, and able to ambulate in the ED. Evaluation does not show pathology that would require ongoing emergent intervention or inpatient  treatment. I explained the diagnosis to the patient. Pain has been managed and has no complaints prior to discharge. Patient is comfortable with above plan and is stable for discharge at this time. All questions were answered prior to disposition. Strict return precautions for returning to the ED were discussed. Encouraged follow up with PCP.    Portions of this note were generated with Scientist, clinical (histocompatibility and immunogenetics). Dictation errors may occur despite best attempts at proofreading.   Final Clinical Impressions(s) / ED Diagnoses   Final diagnoses:  Partial  thickness burn of multiple fingers of right hand excluding thumb, initial encounter    ED Discharge Orders    None       Dietrich PatesKhatri, Danett Palazzo, PA-C 05/23/18 1307    Azalia Bilisampos, Kevin, MD 05/23/18 1322

## 2018-05-23 NOTE — Discharge Instructions (Signed)
Use the Silvadene cream. Follow-up with your primary care provider. Return to the ED for worsening symptoms, signs of infection including redness, warmth, red streaking or fever.

## 2018-07-04 ENCOUNTER — Other Ambulatory Visit (HOSPITAL_BASED_OUTPATIENT_CLINIC_OR_DEPARTMENT_OTHER): Payer: Medicaid Other

## 2018-08-27 ENCOUNTER — Telehealth: Payer: Self-pay | Admitting: Pulmonary Disease

## 2018-08-27 NOTE — Telephone Encounter (Signed)
LMTCB. The mask fit appt is for him to figure out what mask works for him.

## 2018-08-28 NOTE — Telephone Encounter (Signed)
Pt is returning call. Really needs a call back before his appointment at another doctor this afternoon at 1

## 2018-08-28 NOTE — Telephone Encounter (Signed)
Called and spoke with pt and stated to him that at last OV with RA, RA said for pt to a trial of air fit F30 fullface mask to see if this would help decrease leak. Pt verbalized understanding. Nothing further needed.

## 2019-03-01 ENCOUNTER — Telehealth: Payer: Self-pay | Admitting: Pulmonary Disease

## 2019-03-01 DIAGNOSIS — G4733 Obstructive sleep apnea (adult) (pediatric): Secondary | ICD-10-CM

## 2019-03-01 NOTE — Telephone Encounter (Signed)
Order has been placed.nothing further needed. °

## 2019-03-01 NOTE — Telephone Encounter (Signed)
Please order ONO with CPAP/room air to decide

## 2019-03-01 NOTE — Telephone Encounter (Signed)
Called and spoke with pt who wants to know if he is still needing to wear his O2 at night with his cpap.   Dr. Vassie Loll, please advise on this if we should do an ONO with pt just wearing his cpap to see what the results are to see if pt will still need to wear his O2 with it or what you advise [place] doing?

## 2019-03-04 ENCOUNTER — Telehealth: Payer: Self-pay | Admitting: Pulmonary Disease

## 2019-03-04 NOTE — Telephone Encounter (Signed)
ATC pt, call went straight to VM but his VM is not set up. Will try back.

## 2019-03-04 NOTE — Telephone Encounter (Signed)
Per Marylene Land w/ [employer] (ph# [phone])  Gloris Manchester sent to [place] Jansky, Karle Barr, [place]; Arlee Muslim  I held for a long time and had to hang up. Please let the Dr know the below        Previous Messages    ----- Message -----  From: Gloris Manchester  Sent: 03/04/2019  11:28 AM EST  To: Henderson Newcomer, Arlee Muslim, *  Subject: RE: ONO                                         patient is on continous oxygen and needs to be requalified. So he will need his daytime sats checked and an office visit.  Called and left a message

## 2019-03-05 NOTE — Telephone Encounter (Signed)
Called and spoke to pt. Pt states he does have daytime O2 (portable tanks). Appt made with TP for OV and to be qualified at that visit on 1.26.2021. Pt verbalized understanding and denied any further questions or concerns at this time.

## 2019-03-12 ENCOUNTER — Ambulatory Visit: Payer: Medicaid Other | Admitting: Adult Health

## 2019-03-12 ENCOUNTER — Other Ambulatory Visit: Payer: Self-pay

## 2019-03-12 ENCOUNTER — Encounter: Payer: Self-pay | Admitting: Adult Health

## 2019-03-12 VITALS — BP 114/72 | HR 84 | Temp 97.3°F | Ht 69.0 in | Wt >= 6400 oz

## 2019-03-12 DIAGNOSIS — G4733 Obstructive sleep apnea (adult) (pediatric): Secondary | ICD-10-CM | POA: Diagnosis not present

## 2019-03-12 DIAGNOSIS — J9612 Chronic respiratory failure with hypercapnia: Secondary | ICD-10-CM

## 2019-03-12 DIAGNOSIS — J9611 Chronic respiratory failure with hypoxia: Secondary | ICD-10-CM | POA: Diagnosis not present

## 2019-03-12 DIAGNOSIS — I2609 Other pulmonary embolism with acute cor pulmonale: Secondary | ICD-10-CM | POA: Diagnosis not present

## 2019-03-12 NOTE — Assessment & Plan Note (Signed)
Chronic oxygen and respiratory failure.  Patient continue on CPAP with oxygen at bedtime.  Patient has exertional hypoxemia.  Suspect is related to his restrictive lung disease with severe obesity and hypoventilation.  Patient is encouraged on weight loss.  Patient can continue on oxygen 2 L with activity.  Will order portable system patient would like a POC so he can be more mobile and work on weight loss.

## 2019-03-12 NOTE — Patient Instructions (Addendum)
Keep up the good work Continue on CPAP at bedtime with oxygen .  Work on healthy weight Do not drive if sleepy  Work on not smoking.  Continue on Oxygen 2l/m with activity .  Order for POC .  Follow-up with Dr. Vassie Loll in 6 months and as needed

## 2019-03-12 NOTE — Assessment & Plan Note (Signed)
Healthy weight loss encouraged 

## 2019-03-12 NOTE — Assessment & Plan Note (Signed)
Excellent control and compliance on nocturnal CPAP.  Patient continue on current settings  Plan  Patient Instructions  Keep up the good work Continue on CPAP at bedtime with oxygen .  Work on healthy weight Do not drive if sleepy  Work on not smoking.  Continue on Oxygen 2l/m with activity .  Order for POC .  Follow-up with Dr. Vassie Loll in 6 months and as needed

## 2019-03-12 NOTE — Progress Notes (Signed)
@Patient  ID: Aaron Bishop, male    DOB: 07/28/1965, 54 y.o.   MRN: [id number]  Chief Complaint  Patient presents with  . Follow-up    OSA     Referring provider: Dixie Dials, MD  HPI: 54 yo male morbidly obese followed for OSA , Cor Pulmonale and Oxygen dependent respiratory failure (hypercarbic/hypoxic) .   TEST/EVENTS :  06/2012 hospitalized for acute resp failure due to cor pulmonale , OHS/OSA   PSG 03/2012 -Severe OSA - AHI 180/h with nadir desatn 56% ! This was corrected by cpap 17 cm to AHI 18/h   07/2013 Had ONO on RA , + desats , continue on O2 with CPAP  03/12/2019 Follow up : OSA /OHS, O2 RF  Patient returns for 1 year follow up . Patient has underlying OSA/OHS on nocturnal CPAP  With oxygen at 2.5l/m . He does wear oxygen with activity . He does not have a portable system and would like one so he can be more mobile.  Walk test in office O2 sats 85% on room air with ambulation , required 2l/m O2 to maintain O2 sat >90%. O2 sat at rest 96% on room air.  CPAP download shows excellent compliance with daily average usage at 8 hours.  Patient is on auto CPAP 10 to 17 cm H2O.  AHI 2.7.  Minimal leaks.  Patient would like a refill of his supplies.   Patient says he is doing well on CPAP.  Feels that he benefits from CPAP.  Has no significant daytime sleepiness.  Says he never goes without his CPAP when he sleeping. Patient says he does get winded with activity.  Has to stop and rest.  Has not been as active. Patient says he had no leg swelling lately has been doing well with this.  Is trying to eat more fruits and vegetables.   No Known Allergies  Immunization History  Administered Date(s) Administered  . Tdap 05/23/2018    Past Medical History:  Diagnosis Date  . Asthma   . COPD (chronic obstructive pulmonary disease) ([place])   . Hyperlipidemia   . Hypertension   . Morbid obesity ([place])   . Respiratory failure (HCC)     Tobacco History: Social History   Tobacco  Use  Smoking Status Current Every Day Smoker  . Packs/day: 0.20  . Years: 30.00  . Pack years: 6.00  . Types: Cigarettes  Smokeless Tobacco Never Used  Tobacco Comment   2 cigarettes daily   Ready to quit: No Counseling given: Yes Comment: 2 cigarettes daily   Outpatient Medications Prior to Visit  Medication Sig Dispense Refill  . acetaminophen (TYLENOL) 325 MG tablet Take 650 mg by mouth every 6 (six) hours as needed for moderate pain.    Marland Kitchen albuterol (PROVENTIL) (2.5 MG/3ML) 0.083% nebulizer solution Take 2.5 mg by nebulization every 6 (six) hours as needed for wheezing or shortness of breath.    Marland Kitchen amLODipine (NORVASC) 5 MG tablet Take 5 mg by mouth daily.     Marland Kitchen atorvastatin (LIPITOR) 40 MG tablet Take 40 mg by mouth daily.    . fluticasone (FLONASE) 50 MCG/ACT nasal spray Place 2 sprays into both nostrils daily as needed for allergies.   11  . furosemide (LASIX) 40 MG tablet Take 40 mg by mouth daily.    Marland Kitchen lisinopril (PRINIVIL,ZESTRIL) 20 MG tablet Take 20 mg by mouth daily.    Marland Kitchen loratadine (CLARITIN) 10 MG tablet Take 10 mg by mouth daily.    Marland Kitchen  metFORMIN (GLUCOPHAGE) 500 MG tablet Take 1 tablet (500 mg total) by mouth daily with breakfast.    . pravastatin (PRAVACHOL) 40 MG tablet TK 1 T PO  IN THE EVENING  3   No facility-administered medications prior to visit.     Review of Systems:   Constitutional:   No  weight loss, night sweats,  Fevers, chills +, fatigue, or  lassitude.  HEENT:   No headaches,  Difficulty swallowing,  Tooth/dental problems, or  Sore throat,                No sneezing, itching, ear ache, nasal congestion, post nasal drip,   CV:  No chest pain,  Orthopnea, PND, swelling in lower extremities, anasarca, dizziness, palpitations, syncope.   GI  No heartburn, indigestion, abdominal pain, nausea, vomiting, diarrhea, change in bowel habits, loss of appetite, bloody stools.   Resp:   No excess mucus, no productive cough,  No non-productive cough,  No  coughing up of blood.  No change in color of mucus.  No wheezing.  No chest wall deformity  Skin: no rash or lesions.  GU: no dysuria, change in color of urine, no urgency or frequency.  No flank pain, no hematuria   MS:  No joint pain or swelling.  No decreased range of motion.  No back pain.    Physical Exam  BP 114/72 (BP Location: Left Wrist, Cuff Size: Normal)   Pulse 84   Temp (!) 97.3 F (36.3 C) (Temporal)   Ht 5\' 9"  (1.753 m)   Wt (!) 439 lb (199.1 kg)   SpO2 96% Comment: RA  BMI 64.83 kg/m   GEN: A/Ox3; pleasant , NAD, BMI 64   HEENT:  Echo/AT,  , NOSE-clear, THROAT-clear, no lesions, no postnasal drip or exudate noted.   NECK:  Supple w/ fair ROM; no JVD; normal carotid impulses w/o bruits; no thyromegaly or nodules palpated; no lymphadenopathy.    RESP  Clear  P & A; w/o, wheezes/ rales/ or rhonchi. no accessory muscle use, no dullness to percussion  CARD:  RRR, no m/r/g, no peripheral edema, pulses intact, no cyanosis or clubbing.  GI:   Soft & nt; nml bowel sounds; no organomegaly or masses detected.   Musco: Warm bil, no deformities or joint swelling noted.   Neuro: alert, no focal deficits noted.    Skin: Warm, no lesions or rashes    Lab Results:  CBC  BNP No results found for: BNP  ProBNP  Imaging: No results found.    PFT Results Latest Ref Rng & Units 03/19/2014  FVC-Pre L 2.74  FVC-Predicted Pre % 69  FVC-Post L 2.81  FVC-Predicted Post % 71  Pre FEV1/FVC % % 88  Post FEV1/FCV % % 93  FEV1-Pre L 2.41  FEV1-Predicted Pre % 75  FEV1-Post L 2.61  DLCO UNC% % 74  DLCO COR %Predicted % 122  TLC L 4.29  TLC % Predicted % 65  RV % Predicted % 76    No results found for: NITRICOXIDE      Assessment & Plan:   OSA (obstructive sleep apnea) Excellent control and compliance on nocturnal CPAP.  Patient continue on current settings  Plan  Patient Instructions  Keep up the good work Continue on CPAP at bedtime with oxygen .   Work on healthy weight Do not drive if sleepy  Work on not smoking.  Continue on Oxygen 2l/m with activity .  Order for POC .  Follow-up with  Dr. Vassie Loll in 6 months and as needed     Morbid obesity Healthy weight loss encouraged  Chronic respiratory failure with hypoxia and hypercapnia (HCC) Chronic oxygen and respiratory failure.  Patient continue on CPAP with oxygen at bedtime.  Patient has exertional hypoxemia.  Suspect is related to his restrictive lung disease with severe obesity and hypoventilation.  Patient is encouraged on weight loss.  Patient can continue on oxygen 2 L with activity.  Will order portable system patient would like a POC so he can be more mobile and work on weight loss.     Rubye Oaks, NP 03/12/2019

## 2019-09-04 ENCOUNTER — Ambulatory Visit (INDEPENDENT_AMBULATORY_CARE_PROVIDER_SITE_OTHER): Payer: Medicaid Other | Admitting: Pulmonary Disease

## 2019-09-04 ENCOUNTER — Other Ambulatory Visit: Payer: Self-pay

## 2019-09-04 ENCOUNTER — Encounter: Payer: Self-pay | Admitting: Pulmonary Disease

## 2019-09-04 DIAGNOSIS — E662 Morbid (severe) obesity with alveolar hypoventilation: Secondary | ICD-10-CM | POA: Diagnosis not present

## 2019-09-04 DIAGNOSIS — J9611 Chronic respiratory failure with hypoxia: Secondary | ICD-10-CM | POA: Diagnosis not present

## 2019-09-04 DIAGNOSIS — G4733 Obstructive sleep apnea (adult) (pediatric): Secondary | ICD-10-CM

## 2019-09-04 DIAGNOSIS — Z720 Tobacco use: Secondary | ICD-10-CM | POA: Diagnosis not present

## 2019-09-04 DIAGNOSIS — J9612 Chronic respiratory failure with hypercapnia: Secondary | ICD-10-CM

## 2019-09-04 NOTE — Progress Notes (Signed)
   Subjective:    Patient ID: Aaron Bishop, male    DOB: 15-Sep-1965, 54 y.o.   MRN: [id number]  HPI  54 yo morbidly obese smoker, for FU of OSA/ OHS &cor pulmonale  He was hospitalizedin11/2013 & 06/2012 with  hypercarbic respiratory failure secondary to decompensated OSA/OHS and cor pulmonale - DC'd on auto Cpap &3L O2   Last OV 02/2019 >> 85 % RA > started on daytime O2 + POC  Today saturations are 98% on room air, dyspnea is at baseline.  He is compliant with Lasix and denies pedal edema.  Uses oxygen only as needed in the daytime.  More compliant with oxygen during sleep CPAP download was reviewed, no problems with mask or pressure. He has unfortunately started smoking again 2 to 3 cigarettes/day  We discussed transition to Medicaid, he is exploring gastric bypass option but local surgeons will not accept Medicaid  Significant tests/ events reviewed  PSG 03/2012 -Severe OSA - AHI 180/h with nadir desatn 56% ! This was corrected by cpap 17 cm to AHI 18/h   07/2013  ONO on RA , + desats , continue on O2 with CPAP   Review of Systems Patient denies significant dyspnea,cough, hemoptysis,  chest pain, palpitations, pedal edema, orthopnea, paroxysmal nocturnal dyspnea, lightheadedness, nausea, vomiting, abdominal or  leg pains      Objective:   Physical Exam   Gen. Pleasant, morbidly obese, in no distress ENT - no lesions, no post nasal drip Neck: No JVD, no thyromegaly, no carotid bruits Lungs: no use of accessory muscles, no dullness to percussion, decreased without rales or rhonchi  Cardiovascular: Rhythm regular, heart sounds  normal, no murmurs or gallops, no peripheral edema Musculoskeletal: No deformities, no cyanosis or clubbing , no tremors        Assessment & Plan:

## 2019-09-04 NOTE — Addendum Note (Signed)
Addended by: Luna Kitchens D on: 09/04/2019 10:51 AM   Modules accepted: Orders

## 2019-09-04 NOTE — Assessment & Plan Note (Signed)
Smoking cessation again advised

## 2019-09-04 NOTE — Assessment & Plan Note (Signed)
CPAP is working well on current settings. CPAP supplies will be renewed for a year. He is very compliant and CPAP is certainly helped improve his daytime somnolence and fatigue. CPAP download was reviewed which shows excellent control of events on 17 cm on auto CPAP with good compliance  Weight loss encouraged, compliance with goal of at least 4-6 hrs every night is the expectation. Advised against medications with sedative side effects Cautioned against driving when sleepy - understanding that sleepiness will vary on a day to day basis

## 2019-09-04 NOTE — Patient Instructions (Signed)
  CPAP is working well on current settings. CPAP supplies will be renewed for a year. Stay on Lasix

## 2019-09-04 NOTE — Assessment & Plan Note (Signed)
Continue O2 during sleep. Daytime only as needed, for example today oxygen saturations are good on room air but I am sure if he starts retaining fluid or gains more weight, saturations drop

## 2019-09-04 NOTE — Assessment & Plan Note (Signed)
CPAP seems to have been adequate although I suspect some degree of hypoventilation during sleep

## 2019-10-15 ENCOUNTER — Telehealth: Payer: Self-pay | Admitting: Pulmonary Disease

## 2019-10-15 DIAGNOSIS — G4733 Obstructive sleep apnea (adult) (pediatric): Secondary | ICD-10-CM

## 2019-10-15 NOTE — Telephone Encounter (Signed)
Called and spoke to Patient. Patient stated his cpap machine had a burning smell and stopped working. Patient stated he called [facility], and they they advise him to call for a new cpap order from Dr Vassie Loll. Patient's cpap was received in 2014.  Message routed to Dr Vassie Loll

## 2019-10-15 NOTE — Telephone Encounter (Signed)
Prescription has been placed for pt to receive replacement cpap machine. Called and spoke with pt letting him know this had been done and he verbalized understanding. Nothing further needed.

## 2019-10-15 NOTE — Telephone Encounter (Signed)
Please send prescription to DME for replacement auto CPAP 10 to 20 cm

## 2019-10-23 ENCOUNTER — Telehealth: Payer: Self-pay | Admitting: Pulmonary Disease

## 2019-10-23 NOTE — Telephone Encounter (Signed)
LMTCB

## 2019-10-24 NOTE — Telephone Encounter (Signed)
lmtcb for pt.  

## 2019-10-26 NOTE — Telephone Encounter (Signed)
Closing since 2 msgs already left and still no call back

## 2020-05-22 ENCOUNTER — Telehealth: Payer: Self-pay | Admitting: Pulmonary Disease

## 2020-05-22 MED ORDER — AMOXICILLIN-POT CLAVULANATE 875-125 MG PO TABS: 1.0000 | ORAL_TABLET | Freq: Two times a day (BID) | ORAL | 0 refills | Status: DC

## 2020-05-22 NOTE — Telephone Encounter (Signed)
pt is calling because he believes he has an abscess growing in cavity, was prescribed amoxicillin 500mg  by  his dentist & they advised him to call them back if he needed more before his appt on 4/13 .. pt didn't realize that the dentist office was closed on fridays so he called his pcp to see if he could call something in but his pcp is out of town .5/13 because Dr. Marland Kitchen is pt only other provider,  pt is wondering if he could call in antibotic for him. Pt uses [facility] [address] [facility] [address]Please advise  [phone]

## 2020-05-22 NOTE — Telephone Encounter (Signed)
Pt stated that he has been treated by his dentist for an abscess and feels that the last rx did not clear it all the way.  Dentist advised him to call back if he felt he needed another round of abx.  They are closed today and he checked with his PCP and he was not in today either.  Pt wanting to see if RA would call in rx for abx.  RA please advise. Thanks   No Known Allergies

## 2020-05-22 NOTE — Telephone Encounter (Signed)
Called and spoke with pt letting him know the recs per Dr. Vassie Loll and he verbalized understanding. Verified preferred pharmacy and sent Rx for Augmentin to pharmacy for him.nothing further needed.

## 2020-05-22 NOTE — Telephone Encounter (Signed)
OK for Augmentin 875 twice daily for 5 days

## 2020-06-18 ENCOUNTER — Emergency Department (HOSPITAL_COMMUNITY)
Admission: EM | Admit: 2020-06-18 | Discharge: 2020-06-18 | Disposition: A | Discharge status: Home / Self Care | Payer: Medicaid Other | Attending: Emergency Medicine | Admitting: Emergency Medicine

## 2020-06-18 ENCOUNTER — Encounter (HOSPITAL_COMMUNITY): Payer: Self-pay

## 2020-06-18 ENCOUNTER — Emergency Department (HOSPITAL_COMMUNITY): Payer: Medicaid Other

## 2020-06-18 ENCOUNTER — Other Ambulatory Visit: Payer: Self-pay

## 2020-06-18 DIAGNOSIS — M7751 Other enthesopathy of right foot: Secondary | ICD-10-CM

## 2020-06-18 DIAGNOSIS — M778 Other enthesopathies, not elsewhere classified: Secondary | ICD-10-CM | POA: Insufficient documentation

## 2020-06-18 DIAGNOSIS — J449 Chronic obstructive pulmonary disease, unspecified: Secondary | ICD-10-CM | POA: Insufficient documentation

## 2020-06-18 DIAGNOSIS — F17210 Nicotine dependence, cigarettes, uncomplicated: Secondary | ICD-10-CM | POA: Diagnosis not present

## 2020-06-18 DIAGNOSIS — I10 Essential (primary) hypertension: Secondary | ICD-10-CM | POA: Diagnosis not present

## 2020-06-18 DIAGNOSIS — M79671 Pain in right foot: Secondary | ICD-10-CM

## 2020-06-18 DIAGNOSIS — Z79899 Other long term (current) drug therapy: Secondary | ICD-10-CM | POA: Diagnosis not present

## 2020-06-18 DIAGNOSIS — J45909 Unspecified asthma, uncomplicated: Secondary | ICD-10-CM | POA: Insufficient documentation

## 2020-06-18 MED ORDER — PREDNISONE 20 MG PO TABS: 20.0000 mg | ORAL_TABLET | Freq: Two times a day (BID) | ORAL | 0 refills | Status: DC

## 2020-06-18 MED ORDER — OXYCODONE-ACETAMINOPHEN 5-325 MG PO TABS: 1.0000 | ORAL_TABLET | ORAL | 0 refills | Status: DC | PRN

## 2020-06-18 MED ORDER — HYDROCODONE-ACETAMINOPHEN 5-325 MG PO TABS
2.0000 | ORAL_TABLET | Freq: Once | ORAL | Status: AC
  Administered 2020-06-18: 2 via ORAL
  Filled 2020-06-18: qty 2

## 2020-06-18 MED ORDER — PREDNISONE 20 MG PO TABS
60.0000 mg | ORAL_TABLET | Freq: Once | ORAL | Status: AC
  Administered 2020-06-18: 60 mg via ORAL
  Filled 2020-06-18: qty 3

## 2020-06-18 NOTE — ED Triage Notes (Signed)
Patient c/o right foot swelling since yesterday. Patient states last week he had swelling in both feet and it subsided.

## 2020-06-18 NOTE — Discharge Instructions (Signed)
Use heat on the sore area of your foot for 5 times a day for 30 minutes.  Also elevate your right leg above your heart is much as possible.  Wear the walking boot as needed for comfort.  See your doctor if not better in 3 to 4 days.

## 2020-06-18 NOTE — Progress Notes (Signed)
Orthopedic Tech Progress Note Patient Details:  Aaron Bishop Sep 28, 1965 [id number]  Ortho Devices Type of Ortho Device: CAM walker Ortho Device/Splint Location: right Ortho Device/Splint Interventions: Application   Post Interventions Patient Tolerated: Well Instructions Provided: Care of device   [name] 06/18/2020, 1:29 PM

## 2020-06-18 NOTE — ED Provider Notes (Signed)
[facility]-EMERGENCY DEPT Provider Note   CSN: [id number] Arrival date & time: 06/18/20  1034     History Chief Complaint  Patient presents with  . Foot Swelling    Aaron Bishop is a 55 y.o. male.  HPI He is here for evaluation of right foot pain, which started sporadically several days ago.  He describes the pain as "pins-and-needles," when he walks on it.  He denies trauma.  He thought he might have gout.  He denies fever, chills, cough, shortness of breath, back pain, neck pain, focal weakness or paresthesia.  There are no other known modifying factors.    Past Medical History:  Diagnosis Date  . Asthma   . COPD (chronic obstructive pulmonary disease) (HCC)   . Hyperlipidemia   . Hypertension   . Morbid obesity (HCC)   . Respiratory failure [facility])     Patient Active Problem List   Diagnosis Date Noted  . Tobacco abuse 04/20/2016  . Umbilical hernia 04/16/2013  . Obesity hypoventilation syndrome (HCC) 07/15/2012  . Cor pulmonale, acute (HCC) 07/12/2012  . Hyperglycemia 07/12/2012  . Chronic respiratory failure with hypoxia and hypercapnia (HCC) 12/28/2011  . OSA (obstructive sleep apnea) 12/27/2011  . Morbid obesity (HCC) 12/27/2011  . Hypertension 12/27/2011  . Bilateral leg edema 12/27/2011    Past Surgical History:  Procedure Laterality Date  . NO PAST SURGERIES         Family History  Problem Relation Age of Onset  . Asthma Mother   . Allergies Mother   . Cancer Maternal Grandmother     Social History   Tobacco Use  . Smoking status: Current Every Day Smoker    Packs/day: 0.20    Years: 30.00    Pack years: 6.00    Types: Cigarettes  . Smokeless tobacco: Never Used  . Tobacco comment: 2 cigarettes daily  Vaping Use  . Vaping Use: Never used  Substance Use Topics  . Alcohol use: Yes    Alcohol/week: 1.0 standard drink    Types: 1 Cans of beer per week    Comment: daily  . Drug use: Yes    Types: Marijuana     Home Medications Prior to Admission medications   Medication Sig Start Date End Date Taking? Authorizing Provider  oxyCODONE-acetaminophen (PERCOCET) 5-325 MG tablet Take 1 tablet by mouth every 4 (four) hours as needed for moderate pain. 06/18/20  Yes Mancel Bale, MD  predniSONE (DELTASONE) 20 MG tablet Take 1 tablet (20 mg total) by mouth 2 (two) times daily. 06/18/20  Yes Mancel Bale, MD  acetaminophen (TYLENOL) 325 MG tablet Take 650 mg by mouth every 6 (six) hours as needed for moderate pain.    [provider]  albuterol (PROVENTIL) (2.5 MG/3ML) 0.083% nebulizer solution Take 2.5 mg by nebulization every 6 (six) hours as needed for wheezing or shortness of breath.    [provider]  amLODipine (NORVASC) 5 MG tablet Take 5 mg by mouth daily.     [provider]  amoxicillin-clavulanate (AUGMENTIN) 875-125 MG tablet Take 1 tablet by mouth 2 (two) times daily. 05/22/20   Oretha Milch, MD  atorvastatin (LIPITOR) 40 MG tablet Take 40 mg by mouth daily.    [provider]  fluticasone (FLONASE) 50 MCG/ACT nasal spray Place 2 sprays into both nostrils daily as needed for allergies.  09/04/14   [provider]  furosemide (LASIX) 40 MG tablet Take 40 mg by mouth daily. 08/09/12  Oretha Milch, MD  lisinopril (PRINIVIL,ZESTRIL) 20 MG tablet Take 20 mg by mouth daily.    [provider]  loratadine (CLARITIN) 10 MG tablet Take 10 mg by mouth daily.    [provider]  metFORMIN (GLUCOPHAGE) 500 MG tablet Take 1 tablet (500 mg total) by mouth daily with breakfast. 07/19/12   Nyoka Cowden, MD    Allergies    Patient has no known allergies.  Review of Systems   Review of Systems  All other systems reviewed and are negative.   Physical Exam Updated Vital Signs BP 140/75 (BP Location: Right Arm)   Pulse 95   Temp 98.2 F (36.8 C) (Oral)   Resp (!) 22   Ht 5\' 9"  (1.753 m)   Wt (!) 194.1 kg   SpO2 97%   BMI 63.20 kg/m    Physical Exam Vitals and nursing note reviewed.  Constitutional:      General: He is not in acute distress.    Appearance: He is well-developed. He is obese. He is not ill-appearing, toxic-appearing or diaphoretic.  HENT:     Head: Normocephalic and atraumatic.     Right Ear: External ear normal.     Left Ear: External ear normal.  Eyes:     Conjunctiva/sclera: Conjunctivae normal.     Pupils: Pupils are equal, round, and reactive to light.  Neck:     Trachea: Phonation normal.  Cardiovascular:     Rate and Rhythm: Normal rate.  Pulmonary:     Effort: Pulmonary effort is normal.  Abdominal:     General: There is no distension.  Musculoskeletal:     Cervical back: Normal range of motion and neck supple.     Comments: Right foot, tender on dorsum, with slight diffuse swelling.  No localized small joint tenderness or erythema.  No proximal swelling.  Normal range of motion right leg, guards against movement of right foot secondary to pain.  Skin:    General: Skin is warm and dry.  Neurological:     Mental Status: He is alert and oriented to person, place, and time.     Cranial Nerves: No cranial nerve deficit.     Sensory: No sensory deficit.     Motor: No abnormal muscle tone.     Coordination: Coordination normal.  Psychiatric:        Mood and Affect: Mood normal.        Behavior: Behavior normal.        Thought Content: Thought content normal.        Judgment: Judgment normal.     ED Results / Procedures / Treatments   Labs (all labs ordered are listed, but only abnormal results are displayed) Labs Reviewed - No data to display  EKG None  Radiology DG Foot Complete Right  Result Date: 06/18/2020 CLINICAL DATA:  Right foot pain since yesterday.  No known injury. EXAM: RIGHT FOOT COMPLETE - 3+ VIEW COMPARISON:  None. FINDINGS: Moderately large inferior calcaneal spur. Otherwise, normal appearing bones and soft tissues. IMPRESSION: No acute abnormality.  Electronically Signed   By: 08/18/2020 M.D.   On: 06/18/2020 11:19    Procedures Procedures   Medications Ordered in ED Medications  predniSONE (DELTASONE) tablet 60 mg (60 mg Oral Given 06/18/20 1329)  HYDROcodone-acetaminophen (NORCO/VICODIN) 5-325 MG per tablet 2 tablet (2 tablets Oral Given 06/18/20 1330)    ED Course  I have reviewed the triage vital signs and the nursing notes.  Pertinent  labs & imaging results that were available during my care of the patient were reviewed by me and considered in my medical decision making (see chart for details).    MDM Rules/Calculators/A&P                           Patient Vitals for the past 24 hrs:  BP Temp Temp src Pulse Resp SpO2 Height Weight  06/18/20 1044 -- -- -- -- -- -- 5\' 9"  (1.753 m) (!) 194.1 kg  06/18/20 1043 140/75 98.2 F (36.8 C) Oral 95 (!) 22 97 % -- --    2:00 PM Reevaluation with update and discussion. After initial assessment and treatment, an updated evaluation reveals he is comfortable at this time has no further complaints.  Findings discussed and questions answered. 08/18/20   Medical Decision Making:  This patient is presenting for evaluation of right foot pain, occurring without trauma, which does require a range of treatment options, and is a complaint that involves a moderate risk of morbidity and mortality. The differential diagnoses include fracture, tendinitis, arthropathy. I decided to review old records, and in summary middle-aged male presenting with atraumatic pain in right foot, mostly on the dorsum.  I did not require additional historical information from anyone.   Radiologic Tests Ordered, included right foot x-ray.  I independently Visualized: Radiograph images, which show spur of os calcis    Critical Interventions-clinical evaluation, radiography, medication treatment, observation reassess  After These Interventions, the Patient was reevaluated and was found for discharge.  Suspect  foot tendinitis, uncomplicated, which can be treated as an outpatient with rest, elevation, cam walker, prednisone and Norco.  CRITICAL CARE-no Performed by: Mancel Bale  Nursing Notes Reviewed/ Care Coordinated Applicable Imaging Reviewed Interpretation of Laboratory Data incorporated into ED treatment  The patient appears reasonably screened and/or stabilized for discharge and I doubt any other medical condition or other [facility] requiring further screening, evaluation, or treatment in the ED at this time prior to discharge.  Plan: Home Medications-continue usual; Home Treatments-CAM Walker for comfort, elevate right foot, heat to affected area; return here if the recommended treatment, does not improve the symptoms; Recommended follow up-to be follow-up 1 week and as needed     Final Clinical Impression(s) / ED Diagnoses Final diagnoses:  Right foot pain  Tendinitis of right foot    Rx / DC Orders ED Discharge Orders         Ordered    oxyCODONE-acetaminophen (PERCOCET) 5-325 MG tablet  Every 4 hours PRN        06/18/20 1359    predniSONE (DELTASONE) 20 MG tablet  2 times daily        06/18/20 1359           08/18/20, MD 06/18/20 1400

## 2020-08-26 ENCOUNTER — Emergency Department (HOSPITAL_COMMUNITY)
Admission: EM | Admit: 2020-08-26 | Discharge: 2020-08-26 | Disposition: A | Discharge status: Home / Self Care | Payer: Medicaid Other | Attending: Emergency Medicine | Admitting: Emergency Medicine

## 2020-08-26 ENCOUNTER — Encounter (HOSPITAL_COMMUNITY): Payer: Self-pay | Admitting: *Deleted

## 2020-08-26 DIAGNOSIS — J449 Chronic obstructive pulmonary disease, unspecified: Secondary | ICD-10-CM | POA: Diagnosis not present

## 2020-08-26 DIAGNOSIS — F17210 Nicotine dependence, cigarettes, uncomplicated: Secondary | ICD-10-CM | POA: Insufficient documentation

## 2020-08-26 DIAGNOSIS — Z79899 Other long term (current) drug therapy: Secondary | ICD-10-CM | POA: Insufficient documentation

## 2020-08-26 DIAGNOSIS — I10 Essential (primary) hypertension: Secondary | ICD-10-CM | POA: Diagnosis not present

## 2020-08-26 DIAGNOSIS — H6691 Otitis media, unspecified, right ear: Secondary | ICD-10-CM | POA: Diagnosis not present

## 2020-08-26 DIAGNOSIS — H6690 Otitis media, unspecified, unspecified ear: Secondary | ICD-10-CM

## 2020-08-26 DIAGNOSIS — K029 Dental caries, unspecified: Secondary | ICD-10-CM | POA: Insufficient documentation

## 2020-08-26 DIAGNOSIS — J45909 Unspecified asthma, uncomplicated: Secondary | ICD-10-CM | POA: Insufficient documentation

## 2020-08-26 DIAGNOSIS — R519 Headache, unspecified: Secondary | ICD-10-CM | POA: Diagnosis present

## 2020-08-26 MED ORDER — CEFDINIR 300 MG PO CAPS: 300.0000 mg | ORAL_CAPSULE | Freq: Two times a day (BID) | ORAL | 0 refills | Status: AC

## 2020-08-26 NOTE — ED Provider Notes (Signed)
[facility]-EMERGENCY DEPT Provider Note   CSN: [id number] Arrival date & time: 08/26/20  1235     History Chief Complaint  Patient presents with   Facial Pain    Aaron Bishop is a 55 y.o. male with Pmhx HTN, HLD, Asthma, and COPD who presents to the ED today with complaint of gradual onset, constant, sharp, severe, right sided facial pain for the past 2-3 days.  Patient has been taking ibuprofen and Tylenol without relief.  Also tried gargling with salt water and putting peroxide in his ear without relief.  He reports that he recently had some teeth pulled at a dentist office about 2 to 3 months ago.  He was advised he would need to come back for further evaluation of a tooth on the right side of his mouth that potentially would need to be pulled.  He states that the tooth was never giving him issues and so he never followed up however he plans to see them in August.  He states that 2 to 3 days ago he started having this right-sided facial pain and was unsure if it was coming from his tooth or his ear.  He denies any recent swimming.  He has any fevers, chills, drainage from mouth/foul taste, sore throat, difficulty swallowing, trismus, any other associated symptoms.  The history is provided by the patient and medical records.      Past Medical History:  Diagnosis Date   Asthma    COPD (chronic obstructive pulmonary disease) (HCC)    Hyperlipidemia    Hypertension    Morbid obesity (HCC)    Respiratory failure (HCC)     Patient Active Problem List   Diagnosis Date Noted   Tobacco abuse 04/20/2016   Umbilical hernia 04/16/2013   Obesity hypoventilation syndrome (HCC) 07/15/2012   Cor pulmonale, acute (HCC) 07/12/2012   Hyperglycemia 07/12/2012   Chronic respiratory failure with hypoxia and hypercapnia (HCC) 12/28/2011   OSA (obstructive sleep apnea) 12/27/2011   Morbid obesity (HCC) 12/27/2011   Hypertension 12/27/2011   Bilateral leg edema 12/27/2011     Past Surgical History:  Procedure Laterality Date   NO PAST SURGERIES         Family History  Problem Relation Age of Onset   Asthma Mother    Allergies Mother    Cancer Maternal Grandmother     Social History   Tobacco Use   Smoking status: Every Day    Packs/day: 0.20    Years: 30.00    Pack years: 6.00    Types: Cigarettes   Smokeless tobacco: Never   Tobacco comments:    2 cigarettes daily  Vaping Use   Vaping Use: Never used  Substance Use Topics   Alcohol use: Yes    Alcohol/week: 1.0 standard drink    Types: 1 Cans of beer per week    Comment: daily   Drug use: Yes    Types: Marijuana    Home Medications Prior to Admission medications   Medication Sig Start Date End Date Taking? Authorizing Provider  cefdinir (OMNICEF) 300 MG capsule Take 1 capsule (300 mg total) by mouth 2 (two) times daily for 7 days. 08/26/20 09/02/20 Yes Hulet Ehrmann, PA-C  acetaminophen (TYLENOL) 325 MG tablet Take 650 mg by mouth every 6 (six) hours as needed for moderate pain.    [provider]  albuterol (PROVENTIL) (2.5 MG/3ML) 0.083% nebulizer solution Take 2.5 mg by nebulization every 6 (six) hours as needed for wheezing  or shortness of breath.    [provider]  amLODipine (NORVASC) 5 MG tablet Take 5 mg by mouth daily.     [provider]  amoxicillin-clavulanate (AUGMENTIN) 875-125 MG tablet Take 1 tablet by mouth 2 (two) times daily. 05/22/20   Oretha Milch, MD  atorvastatin (LIPITOR) 40 MG tablet Take 40 mg by mouth daily.    [provider]  fluticasone (FLONASE) 50 MCG/ACT nasal spray Place 2 sprays into both nostrils daily as needed for allergies.  09/04/14   [provider]  furosemide (LASIX) 40 MG tablet Take 40 mg by mouth daily. 08/09/12   Oretha Milch, MD  lisinopril (PRINIVIL,ZESTRIL) 20 MG tablet Take 20 mg by mouth daily.    [provider]  loratadine (CLARITIN) 10 MG tablet Take 10 mg by mouth daily.     [provider]  metFORMIN (GLUCOPHAGE) 500 MG tablet Take 1 tablet (500 mg total) by mouth daily with breakfast. 07/19/12   Nyoka Cowden, MD  oxyCODONE-acetaminophen (PERCOCET) 5-325 MG tablet Take 1 tablet by mouth every 4 (four) hours as needed for moderate pain. 06/18/20   Mancel Bale, MD  predniSONE (DELTASONE) 20 MG tablet Take 1 tablet (20 mg total) by mouth 2 (two) times daily. 06/18/20   Mancel Bale, MD    Allergies    Patient has no known allergies.  Review of Systems   Review of Systems  Constitutional:  Negative for chills and fever.  HENT:  Negative for sore throat, trouble swallowing and voice change.        + right sided facial pain  All other systems reviewed and are negative.  Physical Exam Updated Vital Signs BP (!) 161/81 (BP Location: Right Arm)   Pulse 96   Temp 98.3 F (36.8 C) (Oral)   Resp 18   SpO2 93% Comment: uses O2 at home  Physical Exam Vitals and nursing note reviewed.  Constitutional:      Appearance: He is obese. He is not ill-appearing or diaphoretic.  HENT:     Head: Normocephalic and atraumatic.     Right Ear: Tympanic membrane is injected and erythematous.     Left Ear: Tympanic membrane normal.     Mouth/Throat:     Comments: Multiple dental caries throughout mouth. No pain appreciated to any of the teeth on the right side. No erythema to gumline. No abscess. No signs of ludwig's angina.  Eyes:     Conjunctiva/sclera: Conjunctivae normal.  Cardiovascular:     Rate and Rhythm: Normal rate and regular rhythm.  Pulmonary:     Effort: Pulmonary effort is normal.     Breath sounds: Normal breath sounds.  Skin:    General: Skin is warm and dry.     Coloration: Skin is not jaundiced.  Neurological:     Mental Status: He is alert.    ED Results / Procedures / Treatments   Labs (all labs ordered are listed, but only abnormal results are displayed) Labs Reviewed - No data to display  EKG None  Radiology No results  found.  Procedures Procedures   Medications Ordered in ED Medications - No data to display  ED Course  I have reviewed the triage vital signs and the nursing notes.  Pertinent labs & imaging results that were available during my care of the patient were reviewed by me and considered in my medical decision making (see chart for details).    MDM Rules/Calculators/A&P  55 year old male who presents to the ED today with complaint of severe right-sided facial pain for the past 2 days.  Sent dental extraction on left side 2 to 3 months ago.  Was advised that he would need to get a tooth extracted on the right side as well however states it has never bothered him until 2 to 3 days ago, can only see dentist in August.  On arrival to the ED today patient is afebrile, nontachycardic and nontachypneic and appears to be in no acute distress.  On my exam he has poor dentition throughout with dental caries however no erythema to the gumline, no definitive dental pain appreciated at this time.  No abscess.  No concern for Ludwick's angina.  On my exam his right TM is erythematous and injected insistent with acute otitis media which I think is likely causing patient's pain.  He does report he was on amoxicillin about 1 and half months ago and therefore will provide a different antibiotic to cover for ear infection.  We will also provide dental resource guide for patient as he would like a second opinion to see if he truly needs a tooth pulled on his right side or whether he could have a root canal or other intervention.  Patient advised follow-up with his PCP for recheck of his ear in 1 to 2 weeks.  He is in agreement with plan and stable for discharge.   This note was prepared using Dragon voice recognition software and may include unintentional dictation errors due to the inherent limitations of voice recognition software.   Final Clinical Impression(s) / ED Diagnoses Final  diagnoses:  Acute otitis media, unspecified otitis media type    Rx / DC Orders ED Discharge Orders          Ordered    cefdinir (OMNICEF) 300 MG capsule  2 times daily        08/26/20 1330             Discharge Instructions      Please pick up antibiotic and take as prescribed to cover for your ear infection.  Attached is a list of dental resources in the area that you can follow up regarding your dental issues as well however I do not suspect this is the cause of your pain today.   You can take 800 mg Ibuprofen (4 OTC tablets) every 8 hours as needed for pain and 1,000 mg Tylenol (2 OTC double strength tablets) every 8 hours as needed for pain. DO NOT EXCEED 3,200 mg OF IBUPROFEN IN A DAY OR 4,000 mg OF TYLENOL IN A DAY.   Follow up with your PCP in 1-2 weeks for recheck of your ear pain  Return to the ED for any new/worsening symptoms       Tanda Rockers, PA-C 08/26/20 1333    Tegeler, Canary Brim, MD 08/26/20 1537

## 2020-08-26 NOTE — Discharge Instructions (Addendum)
Please pick up antibiotic and take as prescribed to cover for your ear infection.  Attached is a list of dental resources in the area that you can follow up regarding your dental issues as well however I do not suspect this is the cause of your pain today.   You can take 800 mg Ibuprofen (4 OTC tablets) every 8 hours as needed for pain and 1,000 mg Tylenol (2 OTC double strength tablets) every 8 hours as needed for pain. DO NOT EXCEED 3,200 mg OF IBUPROFEN IN A DAY OR 4,000 mg OF TYLENOL IN A DAY.   Follow up with your PCP in 1-2 weeks for recheck of your ear pain  Return to the ED for any new/worsening symptoms

## 2020-08-26 NOTE — ED Triage Notes (Signed)
Pt complains of right facial pain x 2 days. He has tried gargling salt water, putting peroxide in his ear w/o relief.

## 2020-09-03 ENCOUNTER — Ambulatory Visit (INDEPENDENT_AMBULATORY_CARE_PROVIDER_SITE_OTHER): Payer: Medicaid Other | Admitting: Adult Health

## 2020-09-03 ENCOUNTER — Encounter: Payer: Self-pay | Admitting: Adult Health

## 2020-09-03 ENCOUNTER — Other Ambulatory Visit: Payer: Self-pay

## 2020-09-03 VITALS — BP 120/80 | HR 94 | Ht 69.0 in | Wt >= 6400 oz

## 2020-09-03 DIAGNOSIS — G4733 Obstructive sleep apnea (adult) (pediatric): Secondary | ICD-10-CM

## 2020-09-03 DIAGNOSIS — Z720 Tobacco use: Secondary | ICD-10-CM | POA: Diagnosis not present

## 2020-09-03 DIAGNOSIS — J9612 Chronic respiratory failure with hypercapnia: Secondary | ICD-10-CM

## 2020-09-03 DIAGNOSIS — R600 Localized edema: Secondary | ICD-10-CM

## 2020-09-03 DIAGNOSIS — J9611 Chronic respiratory failure with hypoxia: Secondary | ICD-10-CM

## 2020-09-03 DIAGNOSIS — K0889 Other specified disorders of teeth and supporting structures: Secondary | ICD-10-CM | POA: Insufficient documentation

## 2020-09-03 MED ORDER — ALBUTEROL SULFATE (2.5 MG/3ML) 0.083% IN NEBU: 2.5000 mg | INHALATION_SOLUTION | Freq: Four times a day (QID) | RESPIRATORY_TRACT | 3 refills | Status: AC | PRN

## 2020-09-03 MED ORDER — FLUTICASONE PROPIONATE 50 MCG/ACT NA SUSP: 2.0000 | Freq: Every day | NASAL | 6 refills | Status: DC | PRN

## 2020-09-03 MED ORDER — ALBUTEROL SULFATE (2.5 MG/3ML) 0.083% IN NEBU: 2.5000 mg | INHALATION_SOLUTION | Freq: Four times a day (QID) | RESPIRATORY_TRACT | 3 refills | Status: DC | PRN

## 2020-09-03 NOTE — Assessment & Plan Note (Signed)
Appears under good control continue on Lasix and follow-up with primary care

## 2020-09-03 NOTE — Assessment & Plan Note (Signed)
Healthy weight loss 

## 2020-09-03 NOTE — Assessment & Plan Note (Signed)
Ongoing dental issues is causing difficulty to wear CPAP.  Have encouraged him to follow-up with dentist

## 2020-09-03 NOTE — Assessment & Plan Note (Addendum)
Encouraged on CPAP compliance  Patient education given  Referred to DME for evaluation of new mask or mask fitting visit.  Plan  Patient Instructions  Continue on CPAP at bedtime with oxygen .  Need to wear all night long.  Refer to DME for mask fitting or mask change .  Work on healthy weight Do not drive if sleepy  Work on not smoking.  Continue on Oxygen 2l/m with activity .  Low salt diet  Continue on Lasix daily .  Follow-up with Dr. Vassie Loll or Alton Tremblay NP in 3-4 months and as needed

## 2020-09-03 NOTE — Progress Notes (Signed)
@Patient  ID: , male    DOB: 06-22-1965, 55 y.o.   MRN: [id number]  Chief Complaint  Patient presents with   Follow-up    Referring provider: [id number], MD  HPI: 55 year old male morbidly obese smoker followed for OSA and OHS.  Cor pulmonale and oxygen dependent respiratory failure with hypercarbia and hypoxemia   TEST/EVENTS :  06/2012 hospitalized for acute resp failure due to cor pulmonale , OHS/OSA    PSG 03/2012 -Severe OSA - AHI 180/h with nadir desatn 56% ! This was corrected by cpap 17 cm to AHI 18/h     07/2013 Had ONO on RA , + desats , continue on O2 with CPAP'  09/03/2020 Follow up : OSA/OHS, O2 RF  Patient returns for a follow-up visit.  Last seen September 03, 2020.  Patient has underlying obstructive sleep apnea and obesity hypoventilation syndrome on nocturnal CPAP with oxygen at 2.5 L.  He is also on oxygen 2.5 L with activity. Patient says he wears his CPAP each night.  Has been having some difficulty wearing his CPAP recently.  CPAP download shows 90% compliance, daily average usage at 4 hours.  AHI 17.2.  Patient is on auto CPAP 10 to 20 cm H2O. Has dental issues , says cpap straps put facial pressure. Got 1 tooth pulled and is working to find new dentist to complete other side.  Currently wearing full face mask.  Patient education on sleep apnea and potential complications of untreated sleep apnea.  Patient is active smoker.  We discussed smoking cessation.  He is trying to cut down.  Provided resources to help with smoking cessation  Patient's weight is trending back up.  He is now at 446 pounds.  Patient says he has been drinking soda.  We discussed a healthy weight loss   Says leg swelling has been doing well . Takes Lasix 40mg  daily . Follow up with Dr. September 05, 2020 for PCP and cardiology .   No Known Allergies  Immunization History  Administered Date(s) Administered   PFIZER(Purple Top)SARS-COV-2 Vaccination 04/23/2019, 05/24/2019   Tdap  05/23/2018    Past Medical History:  Diagnosis Date   Asthma    COPD (chronic obstructive pulmonary disease) (HCC)    Hyperlipidemia    Hypertension    Morbid obesity (HCC)    Respiratory failure (HCC)     Tobacco History: Social History   Tobacco Use  Smoking Status Every Day   Packs/day: 0.20   Years: 30.00   Pack years: 6.00   Types: Cigarettes  Smokeless Tobacco Never  Tobacco Comments   2 cigarettes daily   Ready to quit: No Counseling given: Yes Tobacco comments: 2 cigarettes daily   Outpatient Medications Prior to Visit  Medication Sig Dispense Refill   acetaminophen (TYLENOL) 325 MG tablet Take 650 mg by mouth every 6 (six) hours as needed for moderate pain.     amoxicillin-clavulanate (AUGMENTIN) 875-125 MG tablet Take 1 tablet by mouth 2 (two) times daily. 10 tablet 0   lisinopril (PRINIVIL,ZESTRIL) 20 MG tablet Take 20 mg by mouth daily.     metFORMIN (GLUCOPHAGE) 500 MG tablet Take 1 tablet (500 mg total) by mouth daily with breakfast.     amLODipine (NORVASC) 5 MG tablet Take 5 mg by mouth daily.  (Patient not taking: Reported on 09/03/2020)     atorvastatin (LIPITOR) 40 MG tablet Take 40 mg by mouth daily. (Patient not taking: Reported on 09/03/2020)     furosemide (LASIX) 40 MG  tablet Take 40 mg by mouth daily. (Patient not taking: Reported on 09/03/2020)     loratadine (CLARITIN) 10 MG tablet Take 10 mg by mouth daily. (Patient not taking: Reported on 09/03/2020)     oxyCODONE-acetaminophen (PERCOCET) 5-325 MG tablet Take 1 tablet by mouth every 4 (four) hours as needed for moderate pain. (Patient not taking: Reported on 09/03/2020) 10 tablet 0   predniSONE (DELTASONE) 20 MG tablet Take 1 tablet (20 mg total) by mouth 2 (two) times daily. (Patient not taking: Reported on 09/03/2020) 10 tablet 0   albuterol (PROVENTIL) (2.5 MG/3ML) 0.083% nebulizer solution Take 2.5 mg by nebulization every 6 (six) hours as needed for wheezing or shortness of breath. (Patient not  taking: Reported on 09/03/2020)     fluticasone (FLONASE) 50 MCG/ACT nasal spray Place 2 sprays into both nostrils daily as needed for allergies.  (Patient not taking: Reported on 09/03/2020)  11   No facility-administered medications prior to visit.     Review of Systems:   Constitutional:   No  weight loss, night sweats,  Fevers, chills,  +fatigue, or  lassitude.  HEENT:   No headaches,  Difficulty swallowing,  Tooth/dental problems, or  Sore throat,                No sneezing, itching, ear ache, nasal congestion, post nasal drip,   CV:  No chest pain,  Orthopnea, PND, swelling in lower extremities, anasarca, dizziness, palpitations, syncope.   GI  No heartburn, indigestion, abdominal pain, nausea, vomiting, diarrhea, change in bowel habits, loss of appetite, bloody stools.   Resp:   No excess mucus, no productive cough,  No non-productive cough,  No coughing up of blood.  No change in color of mucus.  No wheezing.  No chest wall deformity  Skin: no rash or lesions.  GU: no dysuria, change in color of urine, no urgency or frequency.  No flank pain, no hematuria   MS:  No joint pain or swelling.  No decreased range of motion.  No back pain.    Physical Exam  BP 120/80   Pulse 94   Ht 5\' 9"  (1.753 m)   Wt (!) 446 lb (202.3 kg)   SpO2 96%   BMI 65.86 kg/m   GEN: A/Ox3; pleasant , NAD, BMI 65    HEENT:  [place]/AT,  EACs-clear, TMs-wnl, NOSE-clear, THROAT-clear, no lesions, no postnasal drip or exudate noted. Class 3-4 MP airway   NECK:  Supple w/ fair ROM; no JVD; normal carotid impulses w/o bruits; no thyromegaly or nodules palpated; no lymphadenopathy.    RESP  Clear  P & A; w/o, wheezes/ rales/ or rhonchi. no accessory muscle use, no dullness to percussion  CARD:  RRR, no m/r/g, tr  peripheral edema, pulses intact, no cyanosis or clubbing.  GI:   Soft & nt; nml bowel sounds; no organomegaly or masses detected.   Musco: Warm bil, no deformities or joint swelling noted.    Neuro: alert, no focal deficits noted.    Skin: Warm, no lesions or rashes    Lab Results:  CBC  BNP No results found for: BNP  Imaging: No results found.    PFT Results Latest Ref Rng & Units 03/19/2014  FVC-Pre L 2.74  FVC-Predicted Pre % 69  FVC-Post L 2.81  FVC-Predicted Post % 71  Pre FEV1/FVC % % 88  Post FEV1/FCV % % 93  FEV1-Pre L 2.41  FEV1-Predicted Pre % 75  FEV1-Post L 2.61  DLCO uncorrected  ml/min/mmHg 22.08  DLCO UNC% % 74  DLVA Predicted % 122  TLC L 4.29  TLC % Predicted % 65  RV % Predicted % 76    No results found for: NITRICOXIDE      Assessment & Plan:   OSA (obstructive sleep apnea) Encouraged on CPAP compliance  Patient education given  Referred to DME for evaluation of new mask or mask fitting visit.  Plan  Patient Instructions  Continue on CPAP at bedtime with oxygen .  Need to wear all night long.  Refer to DME for mask fitting or mask change .  Work on healthy weight Do not drive if sleepy  Work on not smoking.  Continue on Oxygen 2l/m with activity .  Low salt diet  Continue on Lasix daily .  Follow-up with Dr. Vassie Loll or Shealyn Sean NP in 3-4 months and as needed     Tobacco abuse Smoking cessation discussed   Morbid obesity Healthy weight loss   Chronic respiratory failure with hypoxia and hypercapnia (HCC) Continue on oxygen 2 L with activity and at bedtime with CPAP Patient encouraged on CPAP with oxygen compliance  Bilateral leg edema Appears under good control continue on Lasix and follow-up with primary care  Pain, dental Ongoing dental issues is causing difficulty to wear CPAP.  Have encouraged him to follow-up with dentist     Rubye Oaks, NP 09/03/2020

## 2020-09-03 NOTE — Patient Instructions (Addendum)
Continue on CPAP at bedtime with oxygen .  Need to wear all night long.  Refer to DME for mask fitting or mask change .  Work on healthy weight Do not drive if sleepy  Work on not smoking.  Continue on Oxygen 2l/m with activity .  Low salt diet  Continue on Lasix daily .  Follow-up with Dr. Vassie Loll or Sonu Kruckenberg NP in 3-4 months and as needed

## 2020-09-03 NOTE — Assessment & Plan Note (Signed)
Smoking cessation discussed 

## 2020-09-03 NOTE — Assessment & Plan Note (Signed)
Continue on oxygen 2 L with activity and at bedtime with CPAP Patient encouraged on CPAP with oxygen compliance

## 2020-12-08 ENCOUNTER — Ambulatory Visit (INDEPENDENT_AMBULATORY_CARE_PROVIDER_SITE_OTHER): Payer: Medicaid Other | Admitting: Pulmonary Disease

## 2020-12-08 ENCOUNTER — Encounter: Payer: Self-pay | Admitting: Pulmonary Disease

## 2020-12-08 ENCOUNTER — Other Ambulatory Visit: Payer: Self-pay

## 2020-12-08 DIAGNOSIS — G4733 Obstructive sleep apnea (adult) (pediatric): Secondary | ICD-10-CM

## 2020-12-08 DIAGNOSIS — J9612 Chronic respiratory failure with hypercapnia: Secondary | ICD-10-CM

## 2020-12-08 DIAGNOSIS — J9611 Chronic respiratory failure with hypoxia: Secondary | ICD-10-CM | POA: Diagnosis not present

## 2020-12-08 NOTE — Assessment & Plan Note (Signed)
CPAP download was reviewed which shows residual AHI of 8/hour with large leak and average pressure of 17 cm.  On certain nights AHI is as high as 20/hour. He recently had his teeth pulled and he feels that he is just getting back to his normal CPAP usage and feels that he could improve the leak by wearing the straps tighter now as such he wants to hold off on a sleep study. We will get him back in 6 months and reassess CPAP download and if persistent events he may need repeat titration

## 2020-12-08 NOTE — Patient Instructions (Signed)
Large leak on CPAP  - needs tighter seal on mask

## 2020-12-08 NOTE — Assessment & Plan Note (Signed)
Related to obesity hypoventilation. We will continue using oxygen blended into CPAP machine

## 2020-12-08 NOTE — Progress Notes (Signed)
   Subjective:    Patient ID: Aaron Bishop, male    DOB: Jul 13, 1965, 55 y.o.   MRN: [id number]  HPI  55 yo morbidly obese smoker, for FU of OSA/ OHS & cor pulmonale   He was hospitalized in 12/2011 &  06/2012 with  hypercarbic respiratory failure secondary to decompensated OSA/OHS and  cor pulmonale -  DC'd on auto Cpap & 3L O2    OV 02/2019 >> 85 % RA > started on daytime O2 + POC  Annual visit. He has been unable to lose weight and in fact has gained a few pounds.  He was pursuing bariatric surgery and follows with [facility] for abdominal hernia, was told that they do not take Medicaid. He reports severe tooth ache which was making it difficult to wear CPAP mask, he had teeth pulled on both sides and now is much improved and is back on his CPAP machine regularly. We reviewed download. He has oxygen blended into his CPAP. He denies pedal edema   Significant tests/ events reviewed PSG 03/2012 -Severe OSA - AHI 180/h with nadir desatn 56% ! This was corrected by cpap 17 cm to AHI 18/h     07/2013  ONO on RA , + desats , continue O2 with CPAP 07/2012 ABG 7.34/72/52   Review of Systems neg for any significant sore throat, dysphagia, itching, sneezing, nasal congestion or excess/ purulent secretions, fever, chills, sweats, unintended wt loss, pleuritic or exertional cp, hempoptysis, orthopnea pnd or change in chronic leg swelling. Also denies presyncope, palpitations, heartburn, abdominal pain, nausea, vomiting, diarrhea or change in bowel or urinary habits, dysuria,hematuria, rash, arthralgias, visual complaints, headache, numbness weakness or ataxia.     Objective:   Physical Exam  Gen. Pleasant, obese, in no distress ENT - no lesions, no post nasal drip Neck: No JVD, no thyromegaly, no carotid bruits Lungs: no use of accessory muscles, no dullness to percussion, decreased without rales or rhonchi  Cardiovascular: Rhythm regular, heart sounds  normal, no murmurs or  gallops, no peripheral edema Musculoskeletal: No deformities, no cyanosis or clubbing , no tremors       Assessment & Plan:

## 2021-07-15 DEATH — deceased

## 2021-08-16 ENCOUNTER — Telehealth: Payer: Self-pay | Admitting: Pulmonary Disease

## 2021-08-16 NOTE — Telephone Encounter (Signed)
I received a call from [employer] company asking if we received a Physician's Statement request that was faxed in June.  It was sent to the wrong fax# and will be sent again to fax# [phone].  Per the insurance representative, patient passed away on Jul 13, 2021.  Dentist # [id number].  She will include a copy of the death certificate with the fax.   [employer] ph# [phone]  ext. [phone].     Patient was last seen by Dr. Vassie Loll on 12/05/2020.

## 2021-08-18 NOTE — Telephone Encounter (Addendum)
Received the Healthcare Provider's Statement from [employer].  They included a copy of the patient's death certificate - he died on Jul 08, 2021.  I sent the form back for Dr. Vassie Loll to complete.  Put a copy of the death certificate in to be scanned.  [place] records do not indicate the patient passed away.

## 2021-08-25 NOTE — Telephone Encounter (Signed)
Dr. Vassie Loll signed the Healthcare Provider Statement for [employer] and I faxed the form to Claims Dept. Fax# [phone]

## 2022-10-27 IMAGING — CR DG FOOT COMPLETE 3+V*R*
3 series · 3 of 3 positions shown · non-contrast
Comparison: None.

CLINICAL DATA: Right foot pain since yesterday.  No known injury.

EXAM:
RIGHT FOOT COMPLETE - 3+ VIEW

[x foot lat right]
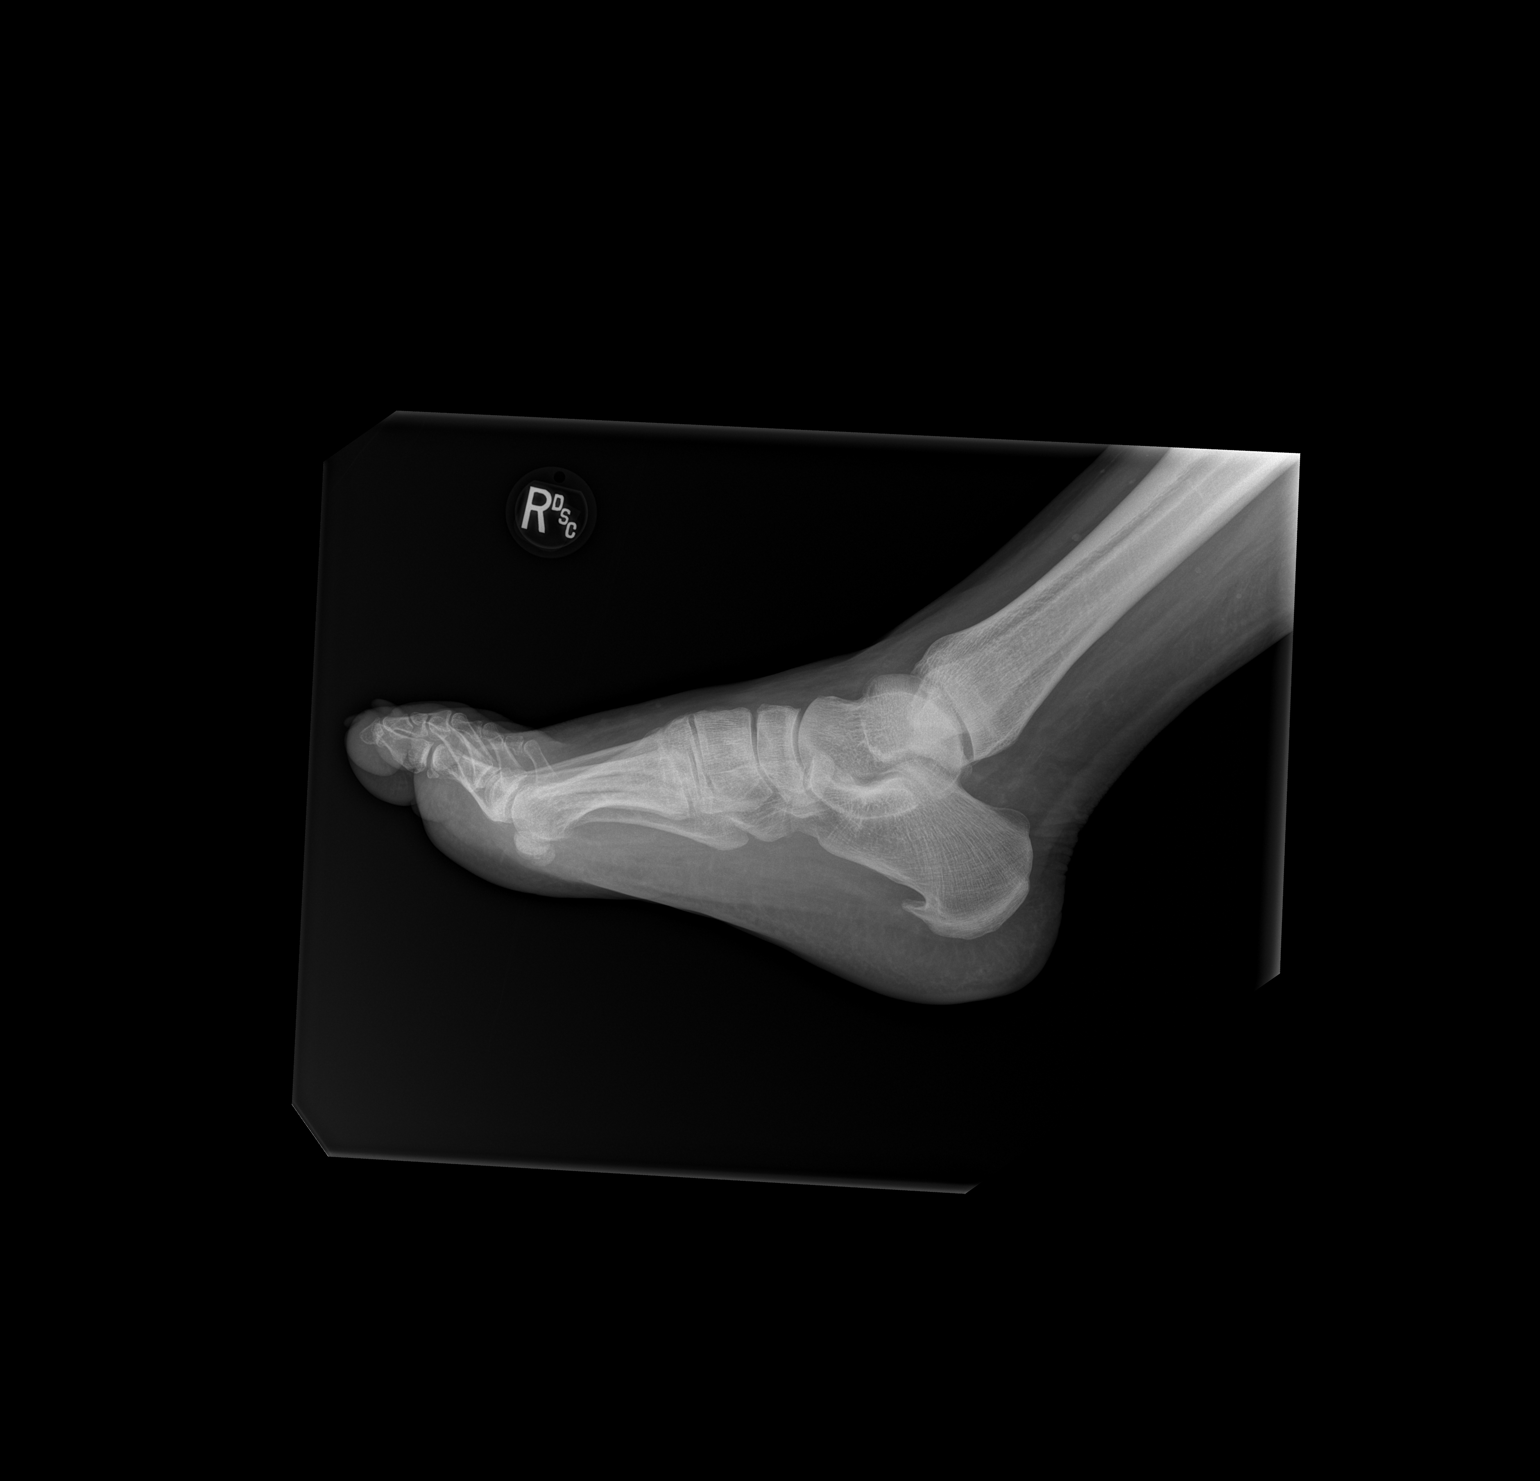

[x foot ap right]
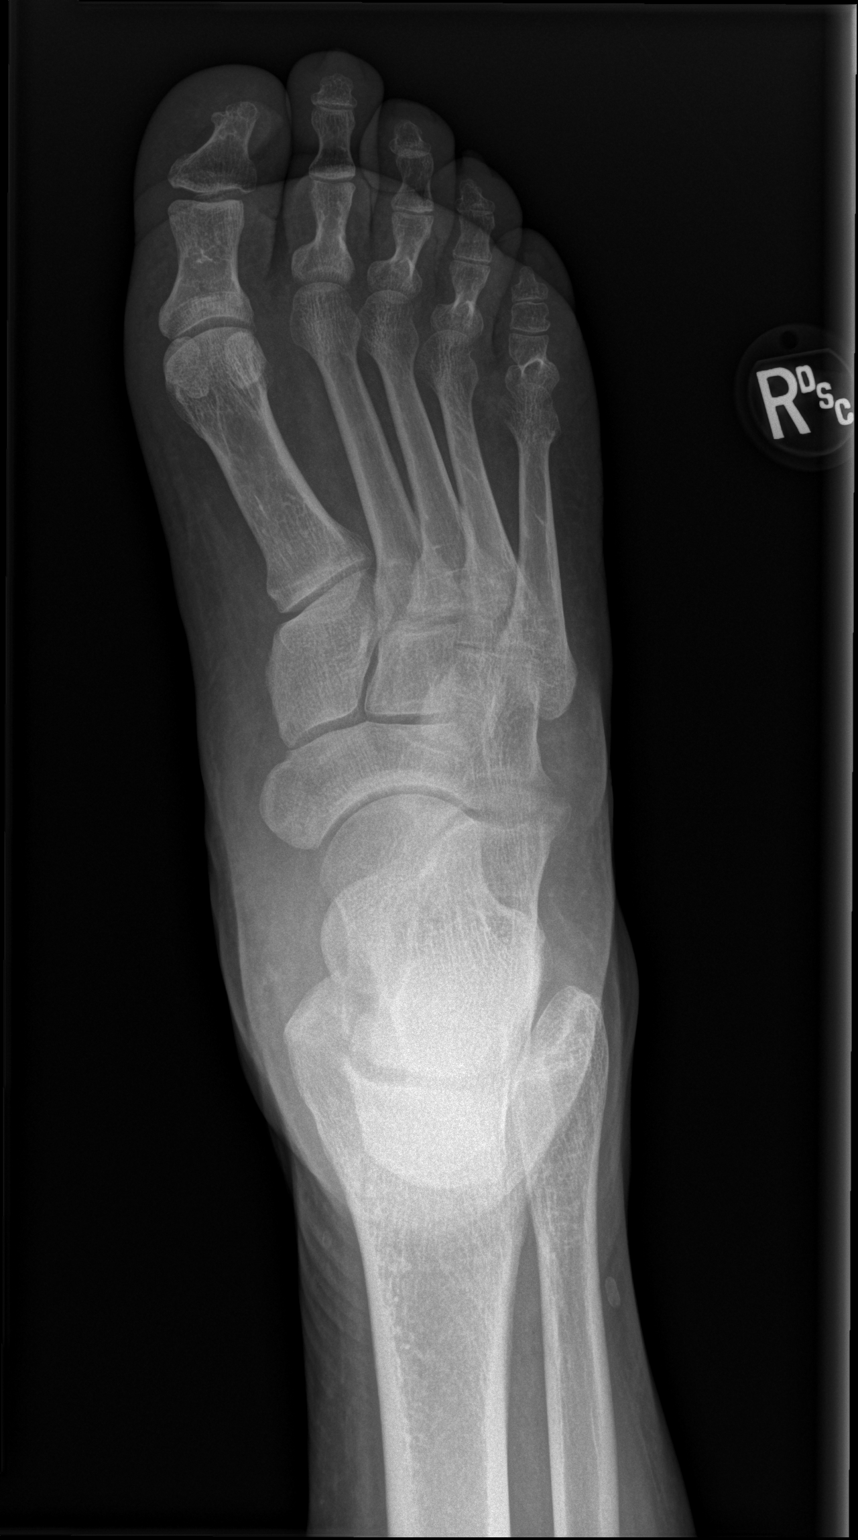

[x foot obl right]
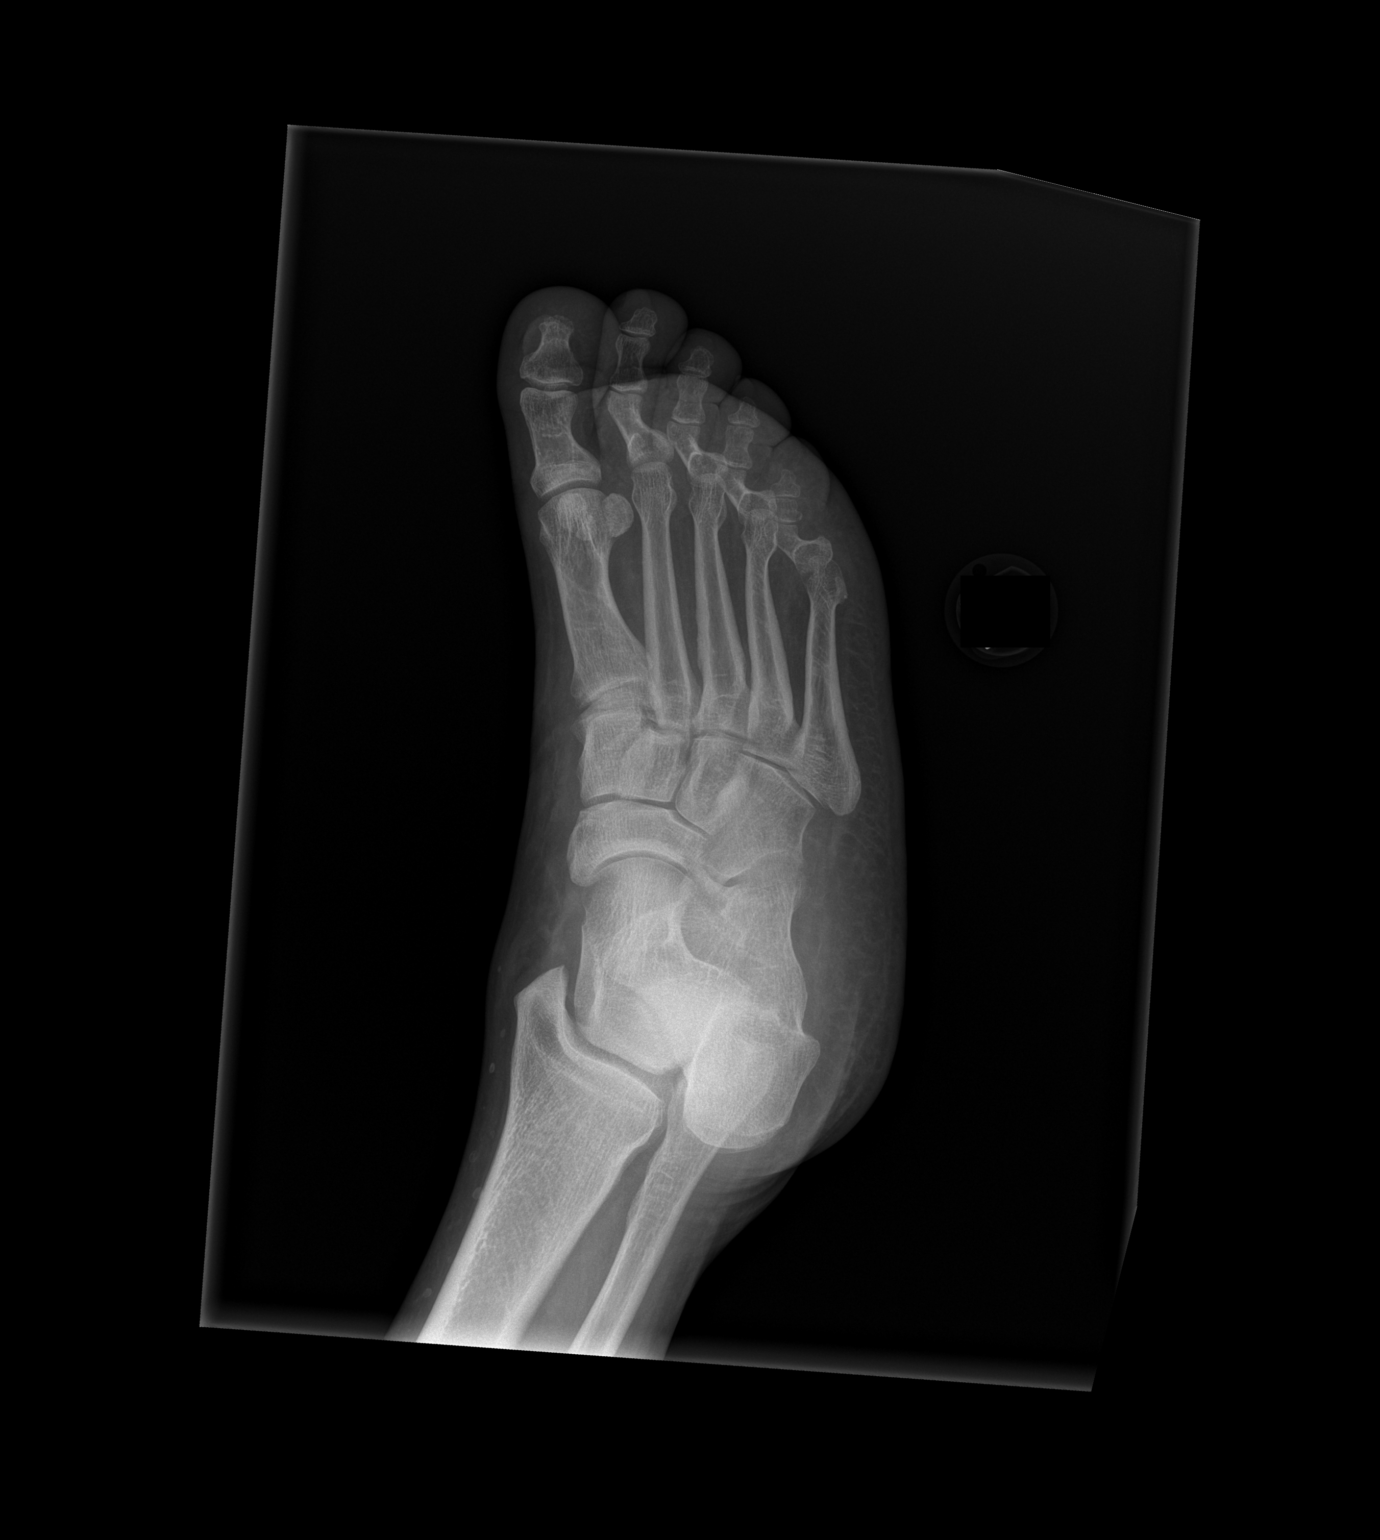

[3 of 3 positions shown; findings below may reference images not displayed]

FINDINGS: Moderately large inferior calcaneal spur. Otherwise, normal
appearing bones and soft tissues.
IMPRESSION: No acute abnormality.
